# Patient Record
Sex: Female | Born: 1960 | Race: White | Hispanic: No | Marital: Married | Smoking: Never smoker
Health system: Southern US, Community
[De-identification: ages and names within clinical notes are randomized; demographics above are authoritative.]

## PROBLEM LIST (undated history)

## (undated) DIAGNOSIS — H8110 Benign paroxysmal vertigo, unspecified ear: Secondary | ICD-10-CM

## (undated) DIAGNOSIS — G43009 Migraine without aura, not intractable, without status migrainosus: Secondary | ICD-10-CM

## (undated) DIAGNOSIS — F329 Major depressive disorder, single episode, unspecified: Secondary | ICD-10-CM

## (undated) HISTORY — DX: Migraine without aura, not intractable, without status migrainosus: G43.009

## (undated) HISTORY — PX: TUBAL LIGATION: SHX77

## (undated) HISTORY — DX: Major depressive disorder, single episode, unspecified: F32.9

## (undated) HISTORY — DX: Benign paroxysmal vertigo, unspecified ear: H81.10

---

## 2005-02-02 LAB — CBC AND DIFFERENTIAL
HEMOGLOBIN: 14.3 g/dL (ref 12.0–16.0)
Platelets: 236 10*3/uL (ref 150–399)
WBC: 5.6 10*3/mL

## 2005-02-02 LAB — LIPID PANEL
CHOLESTEROL: 178 mg/dL (ref 0–200)
HDL: 77 mg/dL — AB (ref 35–70)
LDL Cholesterol: 90 mg/dL
TRIGLYCERIDES: 52 mg/dL (ref 40–160)

## 2005-02-02 LAB — BASIC METABOLIC PANEL
BUN: 9 mg/dL (ref 4–21)
Creatinine: 0.8 mg/dL (ref 0.5–1.1)
Glucose: 86 mg/dL
POTASSIUM: 4.4 mmol/L (ref 3.4–5.3)
SODIUM: 139 mmol/L (ref 137–147)

## 2005-02-02 LAB — HEPATIC FUNCTION PANEL
ALT: 15 U/L (ref 7–35)
AST: 19 U/L (ref 13–35)

## 2010-09-08 ENCOUNTER — Ambulatory Visit: Payer: Self-pay | Admitting: Family Medicine

## 2010-09-08 DIAGNOSIS — G43909 Migraine, unspecified, not intractable, without status migrainosus: Secondary | ICD-10-CM | POA: Insufficient documentation

## 2010-09-09 ENCOUNTER — Telehealth (INDEPENDENT_AMBULATORY_CARE_PROVIDER_SITE_OTHER): Payer: Self-pay | Admitting: *Deleted

## 2011-01-12 NOTE — Assessment & Plan Note (Signed)
Summary: SEVERE HEADACHE/TJ (5)   Vital Signs:  Patient Profile:   50 Years Old Female CC:      Headache/migraine x yesterday Height:     63 inches Weight:      165 pounds O2 Sat:      98 % O2 treatment:    Room Air Temp:     98.4 degrees F oral Pulse rate:   92 / minute Resp:     16 per minute BP sitting:   122 / 84  (left arm) Cuff size:   regular  Pt. in pain?   yes    Location:   head    Intensity:   8  Vitals Entered By: Lajean Saver RN (September 08, 2010 1:46 PM)                   Updated Prior Medication List: IMITREX 50 MG TABS (SUMATRIPTAN SUCCINATE) 100 mg prn DIAZEPAM 5 MG TABS (DIAZEPAM) prn  Current Allergies: ! HYDROCODONEHistory of Present Illness Chief Complaint: Headache/migraine x yesterday History of Present Illness:  Subjective:  Patient has a long history of migraine headaches that occur several times per month.  Her headaches usually respond to 100mg  Imitrex orally, even after a day or  two.  She developed a typical headache yesterday but did not have any more Imitrex.  She had partial improvement with two Aleve tabs.  No fevers, chills, and sweats.  No nausea/vomiting   REVIEW OF SYSTEMS Constitutional Symptoms      Denies fever, chills, night sweats, weight loss, weight gain, and fatigue.  Eyes       Complains of eye pain.      Denies change in vision, eye discharge, glasses, contact lenses, and eye surgery. Ear/Nose/Throat/Mouth       Complains of sinus problems.      Denies hearing loss/aids, change in hearing, ear pain, ear discharge, dizziness, frequent runny nose, frequent nose bleeds, sore throat, hoarseness, and tooth pain or bleeding.  Respiratory       Denies dry cough, productive cough, wheezing, shortness of breath, asthma, bronchitis, and emphysema/COPD.  Cardiovascular       Denies murmurs, chest pain, and tires easily with exhertion.    Gastrointestinal       Denies stomach pain, nausea/vomiting, diarrhea, constipation,  blood in bowel movements, and indigestion. Genitourniary       Denies painful urination, kidney stones, and loss of urinary control. Neurological       Complains of headaches.      Denies paralysis, seizures, and fainting/blackouts. Musculoskeletal       Denies muscle pain, joint pain, joint stiffness, decreased range of motion, redness, swelling, muscle weakness, and gout.      Comments: neck pain Skin       Denies bruising, unusual mles/lumps or sores, and hair/skin or nail changes.  Psych       Denies mood changes, temper/anger issues, anxiety/stress, speech problems, depression, and sleep problems.  Past History:  Past Medical History: Migraines  Past Surgical History: Tubal ligation boney lesion on forehead  Family History: Family History Diabetes 1st degree relative Family History High cholesterol  Social History: Never Smoked Alcohol use-no Drug use-no Smoking Status:  never Drug Use:  no   Objective:  Appearance:  Patient appears healthy, stated age, and in no acute distress  Eyes:  Pupils are equal, round, and reactive to light and accomdation.  Extraocular movement is intact.  Conjunctivae are not inflamed.  Fundi normal Ears:  Canals normal.  Tympanic membranes normal.   Nose:   No sinus tenderness Pharynx:  Normal  Neck:  Supple.  No adenopathy is present.   Lungs:  Clear to auscultation.  Breath sounds are equal.  Heart:  Regular rate and rhythm without murmurs, rubs, or gallops.  Abdomen:  Nontender without masses or hepatosplenomegaly.  Bowel sounds are present.  No CVA or flank tenderness.  Neurologic:  Cranial nerves 2 through 12 are normal.  Patellar, achilles, and elbow reflexes are normal.  Cerebellar function is intact.  Gait and station are normal.    Assessment New Problems: MIGRAINE HEADACHE (ICD-346.90) FAMILY HISTORY DIABETES 1ST DEGREE RELATIVE (ICD-V18.0)   Plan New Medications/Changes: IMITREX 100 MG TABS (SUMATRIPTAN SUCCINATE) 1 by  mouth at first sign of headache.  May repeat once after 2 hours if headache recurs.  Max of 2 tabs/24hr  #9 x 0, 09/08/2010, Donna Christen MD  New Orders: New Patient Level III [id number] Planning Comments:   Resume Imitrex oral.  Return for worsening headache  Follow-up with PCP   The patient and/or caregiver has been counseled thoroughly with regard to medications prescribed including dosage, schedule, interactions, rationale for use, and possible side effects and they verbalize understanding.  Diagnoses and expected course of recovery discussed and will return if not improved as expected or if the condition worsens. Patient and/or caregiver verbalized understanding.  Prescriptions: IMITREX 100 MG TABS (SUMATRIPTAN SUCCINATE) 1 by mouth at first sign of headache.  May repeat once after 2 hours if headache recurs.  Max of 2 tabs/24hr  #9 x 0   Entered and Authorized by:   Donna Christen MD   Signed by:   Donna Christen MD on 09/08/2010   Method used:   Print then Give to Patient   RxID:   [id number]   Orders Added: 1)  New Patient Level III [72536]

## 2011-01-12 NOTE — Progress Notes (Signed)
  Phone Note Outgoing Call Call back at [facility] Phone [phone]   Call placed by: Lajean Saver RN,  September 09, 2010 11:00 AM Call placed to: Patient Summary of Call: Call back: Patient did not answer. Message left with reason for call and call back with any questions

## 2014-10-25 ENCOUNTER — Encounter: Payer: Self-pay | Admitting: Physician Assistant

## 2014-10-25 ENCOUNTER — Ambulatory Visit (INDEPENDENT_AMBULATORY_CARE_PROVIDER_SITE_OTHER): Payer: BC Managed Care – PPO | Admitting: Physician Assistant

## 2014-10-25 VITALS — BP 121/75 | HR 54 | Ht 62.25 in | Wt 165.0 lb

## 2014-10-25 DIAGNOSIS — G43009 Migraine without aura, not intractable, without status migrainosus: Secondary | ICD-10-CM

## 2014-10-25 DIAGNOSIS — F419 Anxiety disorder, unspecified: Secondary | ICD-10-CM

## 2014-10-25 DIAGNOSIS — E669 Obesity, unspecified: Secondary | ICD-10-CM

## 2014-10-25 DIAGNOSIS — R635 Abnormal weight gain: Secondary | ICD-10-CM

## 2014-10-25 DIAGNOSIS — F329 Major depressive disorder, single episode, unspecified: Secondary | ICD-10-CM

## 2014-10-25 DIAGNOSIS — F32A Depression, unspecified: Secondary | ICD-10-CM

## 2014-10-25 HISTORY — DX: Migraine without aura, not intractable, without status migrainosus: G43.009

## 2014-10-25 LAB — VITAMIN B12: Vitamin B-12: 897 pg/mL (ref 211–911)

## 2014-10-25 LAB — TSH: TSH: 3.459 u[IU]/mL (ref 0.350–4.500)

## 2014-10-25 MED ORDER — AMBULATORY NON FORMULARY MEDICATION: Status: DC

## 2014-10-25 MED ORDER — PHENTERMINE-TOPIRAMATE ER 3.75-23 MG PO CP24: ORAL_CAPSULE | ORAL | Status: DC

## 2014-10-25 MED ORDER — SUMATRIPTAN SUCCINATE 100 MG PO TABS: 100.0000 mg | ORAL_TABLET | ORAL | Status: DC | PRN

## 2014-10-25 MED ORDER — PHENTERMINE-TOPIRAMATE ER 7.5-46 MG PO CP24: 1.0000 | ORAL_CAPSULE | Freq: Every morning | ORAL | Status: DC

## 2014-10-26 LAB — VITAMIN D 25 HYDROXY (VIT D DEFICIENCY, FRACTURES): VIT D 25 HYDROXY: 39 ng/mL (ref 30–89)

## 2014-10-29 DIAGNOSIS — E669 Obesity, unspecified: Secondary | ICD-10-CM | POA: Insufficient documentation

## 2014-10-29 DIAGNOSIS — F32A Depression, unspecified: Secondary | ICD-10-CM | POA: Insufficient documentation

## 2014-10-29 DIAGNOSIS — F419 Anxiety disorder, unspecified: Secondary | ICD-10-CM | POA: Insufficient documentation

## 2014-10-29 DIAGNOSIS — E6609 Other obesity due to excess calories: Secondary | ICD-10-CM | POA: Insufficient documentation

## 2014-10-29 DIAGNOSIS — Z6831 Body mass index (BMI) 31.0-31.9, adult: Secondary | ICD-10-CM | POA: Insufficient documentation

## 2014-10-29 DIAGNOSIS — R635 Abnormal weight gain: Secondary | ICD-10-CM | POA: Insufficient documentation

## 2014-10-29 DIAGNOSIS — F329 Major depressive disorder, single episode, unspecified: Secondary | ICD-10-CM | POA: Insufficient documentation

## 2014-10-29 HISTORY — DX: Depression, unspecified: F32.A

## 2014-10-29 NOTE — Progress Notes (Signed)
   Subjective:    Patient ID: Jodi GallusSheila Perry, female    DOB: 10/14/1961, 53 y.o.   MRN: [id number]  HPI Pt is a 53 yo female who presents to the clinic to establish care.   .. Active Ambulatory Problems    Diagnosis Date Noted  . MIGRAINE HEADACHE 09/08/2010  . Migraine without aura and without status migrainosus, not intractable 10/25/2014   Resolved Ambulatory Problems    Diagnosis Date Noted  . No Resolved Ambulatory Problems   No Additional Past Medical History   .Marland Kitchen. Family History  Problem Relation Age of Onset  . Emphysema Mother   . Heart attack Father   . Diabetes Sister    .Marland Kitchen. History   Social History  . Marital Status: Married    Spouse Name: N/A    Number of Children: N/A  . Years of Education: N/A   Occupational History  . Not on file.   Social History Main Topics  . Smoking status: Never Smoker   . Smokeless tobacco: Not on file  . Alcohol Use: No  . Drug Use: No  . Sexual Activity: No   Other Topics Concern  . Not on file   Social History Narrative  . No narrative on file   Migraines with long hx. Went to headache clinic at one point and had injections. imitrex helps with rescue but has migraines at least 3-4 times a week. She has lots of triggers with menstrual cycle, weather, perfumes. She has tried topamax and propranolol in past without benefit.   Pt is frustrated with weight. She is eating a good diet with lots of salads, decreased breads, no fried foods and lots of organic. She does not exercise. Wants something to help with weight loss.    Review of Systems  All other systems reviewed and are negative.      Objective:   Physical Exam  Constitutional: She is oriented to person, place, and time. She appears well-developed and well-nourished.  HENT:  Head: Normocephalic and atraumatic.  Cardiovascular: Normal rate, regular rhythm and normal heart sounds.   Pulmonary/Chest: Effort normal and breath sounds normal.  Neurological: She  is alert and oriented to person, place, and time.  Psychiatric: She has a normal mood and affect. Her behavior is normal.          Assessment & Plan:  Obesity/abnormal weight gain- labs orderd for energy and thyroid. per pt diet seems healthy and balanced. Will start phentermine. Perhaps topamax will also help with headaches. Side effects discussed. Follow up in 2 months.    Migraines- imitrex given for rescue. Take in combination with NSAIDs. May help more with rescue. Let's see how topamax helps with prevention. Follow up in 2 months.   Depression/anxiety- GAD-7 is 6 and PHQ-9 was 10. Discussed treatment. Pt declined medication. Encouraged regular exercise and to consider counseling. Follow up as needed.

## 2014-12-02 ENCOUNTER — Ambulatory Visit (INDEPENDENT_AMBULATORY_CARE_PROVIDER_SITE_OTHER): Payer: BC Managed Care – PPO | Admitting: Physician Assistant

## 2014-12-02 ENCOUNTER — Encounter: Payer: Self-pay | Admitting: Physician Assistant

## 2014-12-02 VITALS — BP 119/76 | HR 78 | Temp 98.0°F | Ht 62.25 in | Wt 161.0 lb

## 2014-12-02 DIAGNOSIS — R635 Abnormal weight gain: Secondary | ICD-10-CM | POA: Diagnosis not present

## 2014-12-02 MED ORDER — PHENTERMINE-TOPIRAMATE ER 11.25-69 MG PO CP24: 1.0000 | ORAL_CAPSULE | Freq: Every morning | ORAL | Status: DC

## 2014-12-02 NOTE — Progress Notes (Signed)
   Subjective:    Patient ID: Candace GallusSheila Bodkin, female    DOB: Sep 16, 1961, 53 y.o.   MRN: [id number]  HPI Pt presents to the clinic 1 month follow up on qsymia. She is doing well on medication. Lost 4lbs. She feels like she has more energy and decreasing food intake. She also feels like frequency of migraines has increased. She is currently walking for exercise. Denies any insomnia, palpitations, headaches etc.    Review of Systems  All other systems reviewed and are negative.      Objective:   Physical Exam  Constitutional: She is oriented to person, place, and time. She appears well-developed and well-nourished.  HENT:  Head: Normocephalic and atraumatic.  Cardiovascular: Normal rate, regular rhythm and normal heart sounds.   Pulmonary/Chest: Effort normal and breath sounds normal. She has no wheezes.  Neurological: She is alert and oriented to person, place, and time.  Psychiatric: She has a normal mood and affect.          Assessment & Plan:  Abnormal weight gain/overweight- increased qsymia to 11.25/69. Follow up in 3 months. Continue to make diet changes and increase exercise.

## 2014-12-19 ENCOUNTER — Encounter: Payer: Self-pay | Admitting: Physician Assistant

## 2015-02-04 ENCOUNTER — Telehealth: Payer: Self-pay

## 2015-02-04 NOTE — Telephone Encounter (Signed)
Sent PA for Qsymia 11.25mg -69mg  through cover my meds and waiting on auth. - CF

## 2015-02-07 NOTE — Telephone Encounter (Signed)
Received fax to submit PA for qsymia by fax so I filled out form and refaxed 02/07/2015 waiting on auth - CF

## 2015-02-10 NOTE — Telephone Encounter (Signed)
Received fax from [facility] and they denied coverage for Qsymia - CF

## 2015-02-11 ENCOUNTER — Other Ambulatory Visit: Payer: Self-pay | Admitting: *Deleted

## 2015-02-11 ENCOUNTER — Other Ambulatory Visit: Payer: Self-pay | Admitting: Physician Assistant

## 2015-02-11 ENCOUNTER — Telehealth: Payer: Self-pay | Admitting: *Deleted

## 2015-02-11 MED ORDER — NALTREXONE-BUPROPION HCL ER 8-90 MG PO TB12: ORAL_TABLET | ORAL | Status: DC

## 2015-02-11 NOTE — Telephone Encounter (Signed)
Pt called back & left vm stating that she would like to try one of the alternative weight loss meds per her ins.  Choices are belviq, contrave, or saxenda.

## 2015-02-11 NOTE — Telephone Encounter (Signed)
Called patient and left message that insurance will not cover Phentermine but they did give alternatives that could be covered. Asked patient to call back if she would like to try one of the alternatives. - CF

## 2015-02-11 NOTE — Telephone Encounter (Signed)
Start contrave. Make sure pt picks up coupon. Write first month for taper.  1 po am for 7 days,  1 po am and 1 po pm for 7 days,  2 po am and 2 po pm for 7 days,  2 po am and 2 po pm daily.

## 2015-02-12 NOTE — Telephone Encounter (Signed)
Received PA for Contrave sent through cover my meds waiting on auth. - CF

## 2015-02-12 NOTE — Telephone Encounter (Signed)
Contrave taper sent.  LMOM notifying pt.

## 2015-02-18 ENCOUNTER — Telehealth: Payer: Self-pay | Admitting: *Deleted

## 2015-02-18 NOTE — Telephone Encounter (Signed)
PA approved for contrave.  Approval dates 02/18/15-06/20/15. Pharmacy and pt notified.

## 2015-02-24 NOTE — Telephone Encounter (Signed)
Called [facility] and spoke with Malachi BondsGloria she said the Contrave was approved 02/18/15 - 06/20/15

## 2015-03-03 ENCOUNTER — Ambulatory Visit (INDEPENDENT_AMBULATORY_CARE_PROVIDER_SITE_OTHER): Payer: BLUE CROSS/BLUE SHIELD | Admitting: Physician Assistant

## 2015-03-03 ENCOUNTER — Encounter: Payer: Self-pay | Admitting: Physician Assistant

## 2015-03-03 VITALS — BP 126/87 | HR 74 | Wt 162.0 lb

## 2015-03-03 DIAGNOSIS — Z23 Encounter for immunization: Secondary | ICD-10-CM

## 2015-03-03 DIAGNOSIS — E663 Overweight: Secondary | ICD-10-CM | POA: Diagnosis not present

## 2015-03-03 DIAGNOSIS — G43009 Migraine without aura, not intractable, without status migrainosus: Secondary | ICD-10-CM

## 2015-03-03 DIAGNOSIS — Z299 Encounter for prophylactic measures, unspecified: Secondary | ICD-10-CM

## 2015-03-03 DIAGNOSIS — Z6830 Body mass index (BMI) 30.0-30.9, adult: Secondary | ICD-10-CM | POA: Insufficient documentation

## 2015-03-03 MED ORDER — PHENTERMINE HCL 15 MG PO TBDP: ORAL_TABLET | ORAL | Status: DC

## 2015-03-03 MED ORDER — TOPIRAMATE 50 MG PO TABS: ORAL_TABLET | ORAL | Status: DC

## 2015-03-05 DIAGNOSIS — Z789 Other specified health status: Secondary | ICD-10-CM | POA: Insufficient documentation

## 2015-03-05 NOTE — Progress Notes (Signed)
   Subjective:    Patient ID: Jodi Perry, female    DOB: 04-26-1961, 54 y.o.   MRN: [id number]  HPI Pt presents to the clinic for follow up on weight with contrave. She has liked the appetitie suppression and the decreased cravings for sweets;however she has noticed her migraines have been more and more frequent since starting contrave. She has been on topamax in the past to control but before medication had been doing much better. On average she is having a migraine every other day. Insurance would not pay for belviq or qsymia.    Review of Systems  All other systems reviewed and are negative.      Objective:   Physical Exam  Constitutional: She is oriented to person, place, and time. She appears well-developed and well-nourished.  HENT:  Head: Normocephalic and atraumatic.  Cardiovascular: Normal rate, regular rhythm and normal heart sounds.   Pulmonary/Chest: Effort normal and breath sounds normal.  Neurological: She is alert and oriented to person, place, and time.  Skin: Skin is dry.  Psychiatric: She has a normal mood and affect. Her behavior is normal.          Assessment & Plan:  Migraine- increase seems like medication side effect from contrave. Discussed with patient to taper off. Refill imitrex as needed for rescue. Will start back topamax for migraine prevention and perhaps could help with weight loss. Follow up if migraines persist.   Overweight- topamax may help but will also give phentermine lower dose for next month. Follow up in 1 month. Discussed diet and exercise in addition to medication.

## 2015-03-25 LAB — LIPID PANEL
Cholesterol: 154 mg/dL (ref 0–200)
HDL: 59 mg/dL (ref 35–70)
LDL Cholesterol: 73 mg/dL
Triglycerides: 106 mg/dL (ref 40–160)

## 2015-03-25 LAB — BASIC METABOLIC PANEL: Glucose: 88 mg/dL

## 2015-03-26 ENCOUNTER — Telehealth: Payer: Self-pay | Admitting: Physician Assistant

## 2015-03-26 NOTE — Telephone Encounter (Signed)
Call pt: received copy of labs. Except weight everything else looks good.

## 2015-03-27 NOTE — Telephone Encounter (Signed)
LMOM notifying pt of results.  

## 2015-03-28 ENCOUNTER — Encounter: Payer: Self-pay | Admitting: Physician Assistant

## 2015-04-16 ENCOUNTER — Encounter: Payer: Self-pay | Admitting: Family Medicine

## 2015-04-16 ENCOUNTER — Ambulatory Visit (INDEPENDENT_AMBULATORY_CARE_PROVIDER_SITE_OTHER): Payer: BLUE CROSS/BLUE SHIELD | Admitting: Family Medicine

## 2015-04-16 VITALS — BP 127/87 | HR 72 | Ht 62.0 in | Wt 160.0 lb

## 2015-04-16 DIAGNOSIS — M7741 Metatarsalgia, right foot: Secondary | ICD-10-CM | POA: Diagnosis not present

## 2015-04-16 DIAGNOSIS — E669 Obesity, unspecified: Secondary | ICD-10-CM | POA: Diagnosis not present

## 2015-04-16 DIAGNOSIS — M7742 Metatarsalgia, left foot: Secondary | ICD-10-CM | POA: Diagnosis not present

## 2015-04-16 MED ORDER — PHENTERMINE HCL 30 MG PO TBDP: 1.0000 | ORAL_TABLET | ORAL | Status: DC

## 2015-04-16 NOTE — Progress Notes (Signed)
CC: Candace GallusSheila Perry is a 54 y.o. female is here for FOLLOW-UP meds   Subjective: HPI:  Follow-up obesity: Has been taking 50 mg of phentermine every morning for the past 30 days. She believes she's lost about 3 or 4 pounds since she started this medication and has not noticed any side effects. She's been unable to work out after a long day at work due to unbearable pain in her feet. She would like to start an exercise program however pain is interfering with this. She is trying to eat healthy and she believes the medication is helping with portion control. Denies sleep disturbance anxiety nor any mental disturbance.  She localizes the pain in both feet just proximal to the second third and fourth metatarsal heads. It's present only when bearing weight. It's worse the longer she has to work on her feet. No interventions as of yet. It's been present for at least a month on a daily basis. She denies any overlying skin changes or trauma nor any joint pain elsewhere.   Review Of Systems Outlined In HPI  No past medical history on file.  Past Surgical History  Procedure Laterality Date  . Tubal ligation     Family History  Problem Relation Age of Onset  . Emphysema Mother   . Heart attack Father   . Diabetes Sister     History   Social History  . Marital Status: Married    Spouse Name: N/A  . Number of Children: N/A  . Years of Education: N/A   Occupational History  . Not on file.   Social History Main Topics  . Smoking status: Never Smoker   . Smokeless tobacco: Not on file  . Alcohol Use: No  . Drug Use: No  . Sexual Activity: No   Other Topics Concern  . Not on file   Social History Narrative     Objective: BP 127/87 mmHg  Pulse 72  Ht 5\' 2"  (1.575 m)  Wt 160 lb (72.576 kg)  BMI 29.26 kg/m2  Vital signs reviewed. General: Alert and Oriented, No Acute Distress HEENT: Pupils equal, round, reactive to light. Conjunctivae clear.  External ears unremarkable.  Moist  mucous membranes. Lungs: Clear and comfortable work of breathing, speaking in full sentences without accessory muscle use. Cardiac: Regular rate and rhythm.  Neuro: CN II-XII grossly intact, gait normal. Extremities: No peripheral edema.  Strong peripheral pulses. Hallux valgus bilaterally, hammer toes of the second and third toes bilaterally. Pain is reproduced with palpation of the heads of the second third or fourth metatarsal heads bilaterally without any overlying skin changes Mental Status: No depression, anxiety, nor agitation. Logical though process. Skin: Warm and dry.  Assessment & Plan: Velna HatchetSheila was seen today for follow-up meds.  Diagnoses and all orders for this visit:  Metatarsalgia of both feet  Obesity  Other orders -     Phentermine HCl 30 MG TBDP; Take 1 tablet by mouth every morning.   Metatarsalgia: She was given adhesive pads for her shoes to help offload the metatarsal heads in hopes of helping her pain so she can begin exercising regularly. We did not put the pads in her current footwear instead she will use them in her work shoes.  Discussed proper placement and it may take some minor adjustments to find the right sweet spot. Obesity: Improving continue phentermine, she is not satisfied with her minimal weight loss, she's tolerating phentermine so will slightly increase the dose  Return in about 4  weeks (around 05/14/2015) for Weight check blood pressure nurse visit.

## 2015-05-20 ENCOUNTER — Ambulatory Visit (INDEPENDENT_AMBULATORY_CARE_PROVIDER_SITE_OTHER): Payer: BLUE CROSS/BLUE SHIELD | Admitting: Family Medicine

## 2015-05-20 VITALS — BP 118/80 | HR 60 | Wt 155.0 lb

## 2015-05-20 DIAGNOSIS — R635 Abnormal weight gain: Secondary | ICD-10-CM | POA: Diagnosis not present

## 2015-05-20 MED ORDER — PHENTERMINE HCL 30 MG PO TBDP: 1.0000 | ORAL_TABLET | ORAL | Status: DC

## 2015-05-20 NOTE — Progress Notes (Signed)
Weight loss success, refills provided  

## 2015-05-20 NOTE — Progress Notes (Signed)
   Subjective:    Patient ID: Jodi GallusSheila Perry, female    DOB: [birth year], 54 y.o.   MRN: [id number]  HPI Patient is here for blood pressure and weight check. Denies any trouble sleeping, palpitations, or any other medication problems. Patient does report last week she had a migraine that lasted 3 days and she just began her menstrual cycle today which "may explain why she only lost 5 pounds."   Review of Systems     Objective:   Physical Exam        Assessment & Plan:  Patient has lost weight. A refill for Phentermine will be sent to patient preferred pharmacy. Patient advised to schedule a four week nurse visit and keep her upcoming appointment with her PCP. Verbalized understanding, no further questions.

## 2015-05-30 ENCOUNTER — Other Ambulatory Visit: Payer: Self-pay | Admitting: Physician Assistant

## 2015-06-23 ENCOUNTER — Ambulatory Visit (INDEPENDENT_AMBULATORY_CARE_PROVIDER_SITE_OTHER): Payer: BLUE CROSS/BLUE SHIELD | Admitting: Family Medicine

## 2015-06-23 VITALS — BP 119/78 | HR 78 | Wt 154.0 lb

## 2015-06-23 DIAGNOSIS — R635 Abnormal weight gain: Secondary | ICD-10-CM | POA: Diagnosis not present

## 2015-06-23 MED ORDER — PHENTERMINE HCL 30 MG PO TBDP: 1.0000 | ORAL_TABLET | ORAL | Status: DC

## 2015-06-23 NOTE — Progress Notes (Signed)
   Subjective:    Patient ID: Jodi Perry, female    DOB: 08-02-1961, 54 y.o.   MRN: [id number]  HPI  Jodi Perry is here for blood pressure and weight check. Diet and exercise is going well. Denies trouble sleeping or palpitations.    Review of Systems     Objective:   Physical Exam        Assessment & Plan:  Abnormal weight gain - Patient has lost weight. A refill will be sent to [facility]. Only lost one lb. BP at goal.    Nani Gasseratherine Metheney, MD

## 2015-06-24 ENCOUNTER — Other Ambulatory Visit: Payer: Self-pay | Admitting: Physician Assistant

## 2015-06-29 ENCOUNTER — Other Ambulatory Visit: Payer: Self-pay | Admitting: Physician Assistant

## 2015-07-15 ENCOUNTER — Encounter: Payer: Self-pay | Admitting: Family Medicine

## 2015-07-15 ENCOUNTER — Ambulatory Visit (INDEPENDENT_AMBULATORY_CARE_PROVIDER_SITE_OTHER): Payer: BLUE CROSS/BLUE SHIELD | Admitting: Family Medicine

## 2015-07-15 VITALS — BP 130/86 | HR 71 | Ht 62.0 in | Wt 153.0 lb

## 2015-07-15 DIAGNOSIS — H8110 Benign paroxysmal vertigo, unspecified ear: Secondary | ICD-10-CM

## 2015-07-15 DIAGNOSIS — H8111 Benign paroxysmal vertigo, right ear: Secondary | ICD-10-CM

## 2015-07-15 HISTORY — DX: Benign paroxysmal vertigo, unspecified ear: H81.10

## 2015-07-15 MED ORDER — MECLIZINE HCL 25 MG PO TABS: 25.0000 mg | ORAL_TABLET | Freq: Three times a day (TID) | ORAL | Status: DC | PRN

## 2015-07-15 NOTE — Progress Notes (Signed)
Jodi Perry is a 54 y.o. female who presents to [facility]  today for dizziness dizziness. Patient notes a several week history of dizziness. She describes a room spinning sensation it worsens when she turns her head. She denies any weakness or numbness or loss of function. She gets nauseated but has not vomited. She denies any feeling as if she will pass out chest pains or palpitations or shortness of breath. She has not tried any medications yet.   No past medical history on file. Past Surgical History  Procedure Laterality Date  . Tubal ligation     History  Substance Use Topics  . Smoking status: Never Smoker   . Smokeless tobacco: Not on file  . Alcohol Use: No   ROS as above Medications: Current Outpatient Prescriptions  Medication Sig Dispense Refill  . diclofenac (VOLTAREN) 75 MG EC tablet Take 75 mg by mouth. Take one tablet by mouth 2 times daily.    Marland Kitchen loratadine (CLARITIN) 10 MG tablet Take 10 mg by mouth. Take one by mouth daily    . Phentermine HCl 30 MG TBDP Take 1 tablet by mouth every morning. 30 tablet 0  . pseudoephedrine (SUDAFED) 120 MG 12 hr tablet Take 120 mg by mouth. Take one by mouth twice daily as needed.    . SUMAtriptan (IMITREX) 100 MG tablet TAKE 1 TABLET BY MOUTH AS NEEDED FOR MIGRAINE OR HEADACHE. MAY REPEAT IN 2 HOURS IS HEADACHE PERSIST 10 tablet 5  . tiZANidine (ZANAFLEX) 4 MG tablet Take 4 mg by mouth. Take on tablet by mouth 3 times daily.    Marland Kitchen topiramate (TOPAMAX) 50 MG tablet TAKE ONE TABLET TWICE A DAY. 60 tablet 0  . meclizine (ANTIVERT) 25 MG tablet Take 1-2 tablets (25-50 mg total) by mouth 3 (three) times daily as needed. 60 tablet 1   No current facility-administered medications for this visit.   Allergies  Allergen Reactions  . Contrave [Naltrexone-Bupropion Hcl Er]     Trigger migraines.   . Hydrocodone      Exam:  BP 130/86 mmHg  Pulse 71  Ht  (1.575 m)  Wt 153 lb (69.4 kg)  BMI  27.98 kg/m2 Gen: Well NAD HEENT: EOMI,  MMM PERRL Lungs: Normal work of breathing. CTABL Heart: RRR no MRG Abd: NABS, Soft. Nondistended, Nontender Exts: Brisk capillary refill, warm and well perfused.  Neuro: Alert and oriented normal coordination balance gait and reflexes and strength. Positive right-sided Dix-Hallpike test.  No results found for this or any previous visit (from the past 24 hour(s)). No results found.   Please see individual assessment and plan sections.

## 2015-07-15 NOTE — Patient Instructions (Addendum)
Thank you for coming in today. Call or go to the emergency room if you get worse, have trouble breathing, have chest pains, or palpitations.   Attend vestibular physical therapy.   Do the Eplay maneuver. You can look at the handout and at [employer].  Use meclizine as needed  If you never hear from PT please call [facility]) - [phone]  Benign Positional Vertigo Vertigo means you feel like you or your surroundings are moving when they are not. Benign positional vertigo is the most common form of vertigo. Benign means that the cause of your condition is not serious. Benign positional vertigo is more common in older adults. CAUSES  Benign positional vertigo is the result of an upset in the labyrinth system. This is an area in the middle ear that helps control your balance. This may be caused by a viral infection, head injury, or repetitive motion. However, often no specific cause is found. SYMPTOMS  Symptoms of benign positional vertigo occur when you move your head or eyes in different directions. Some of the symptoms may include:  Loss of balance and falls.  Vomiting.  Blurred vision.  Dizziness.  Nausea.  Involuntary eye movements (nystagmus). DIAGNOSIS  Benign positional vertigo is usually diagnosed by physical exam. If the specific cause of your benign positional vertigo is unknown, your caregiver may perform imaging tests, such as magnetic resonance imaging (MRI) or computed tomography (CT). TREATMENT  Your caregiver may recommend movements or procedures to correct the benign positional vertigo. Medicines such as meclizine, benzodiazepines, and medicines for nausea may be used to treat your symptoms. In rare cases, if your symptoms are caused by certain conditions that affect the inner ear, you may need surgery. HOME CARE INSTRUCTIONS   Follow your caregiver's instructions.  Move slowly. Do not make sudden body or head  movements.  Avoid driving.  Avoid operating heavy machinery.  Avoid performing any tasks that would be dangerous to you or others during a vertigo episode.  Drink enough fluids to keep your urine clear or pale yellow. SEEK IMMEDIATE MEDICAL CARE IF:   You develop problems with walking, weakness, numbness, or using your arms, hands, or legs.  You have difficulty speaking.  You develop severe headaches.  Your nausea or vomiting continues or gets worse.  You develop visual changes.  Your family or friends notice any behavioral changes.  Your condition gets worse.  You have a fever.  You develop a stiff neck or sensitivity to light. MAKE SURE YOU:   Understand these instructions.  Will watch your condition.  Will get help right away if you are not doing well or get worse. Document Released: 09/06/2006 Document Revised: 02/21/2012 Document Reviewed: 08/19/2011 [facility] Patient Information 2015 [place], [place]. This information is not intended to replace advice given to you by your health care provider. Make sure you discuss any questions you have with your health care provider.

## 2015-07-15 NOTE — Assessment & Plan Note (Signed)
Symptoms consistent with BPPV. Referred to vestibular physical therapy. Prescribed meclizine and home Epley maneuvers.

## 2015-07-17 ENCOUNTER — Telehealth: Payer: Self-pay | Admitting: *Deleted

## 2015-07-17 NOTE — Telephone Encounter (Signed)
Pt left vm this afternoon stating that her vertigo has not improved at all.  She's taking the med & doing the exercises.  She wants to know if it's normal to see no improvement.  Please advise.

## 2015-07-18 ENCOUNTER — Ambulatory Visit
Payer: BLUE CROSS/BLUE SHIELD | Attending: Family Medicine | Admitting: Rehabilitative and Restorative Service Providers"

## 2015-07-18 DIAGNOSIS — H8111 Benign paroxysmal vertigo, right ear: Secondary | ICD-10-CM

## 2015-07-18 DIAGNOSIS — R42 Dizziness and giddiness: Secondary | ICD-10-CM | POA: Diagnosis present

## 2015-07-18 NOTE — Telephone Encounter (Signed)
LMOM notifying pt of information. 

## 2015-07-18 NOTE — Telephone Encounter (Signed)
She should be going to vestibular rehabilitation. Referral was placed. Asked patient to call [phone]  to schedule an appointment.

## 2015-07-18 NOTE — Patient Instructions (Signed)
Gaze Stabilization: Tip Card 1.Target must remain in focus, not blurry, and appear stationary while head is in motion. 2.Perform exercises with small head movements (45 to either side of midline). 3.Increase speed of head motion so long as target is in focus. 4.If you wear eyeglasses, be sure you can see target through lens (therapist will give specific instructions for bifocal / progressive lenses). 5.These exercises may provoke dizziness or nausea. Work through these symptoms. If too dizzy, slow head movement slightly. Rest between each exercise. 6.Exercises demand concentration; avoid distractions. 7.For safety, perform standing exercises close to a counter, wall, corner, or next to someone.  Copyright  VHI. All rights reserved.  Gaze Stabilization: Standing Feet Apart   Feet shoulder width apart, keeping eyes on target on wall ___3_ feet away, tilt head down slightly and move head side to side for _10 times. Repeat while moving head up and down for 10 times. Do _2___ sessions per day.  Copyright  VHI. All rights reserved.   Feet Apart, Head Motion - Eyes Open   With eyes open, feet apart, move head slowly: side to side *reach across your body to touch the opposite wall so you look further side to side*. Repeat _5-_10__ times per session. Do __2__ sessions per day.  Copyright  VHI. All rights reserved.  Healthy Back - Shoulder Roll   Stand straight with arms relaxed at sides. Roll shoulders backward continuously. Do __10__ times.   Copyright  VHI. All rights reserved.    Special Instructions: Exercises may bring on mild to moderate symptoms of dizziness or headache that resolve within 30 minutes of completing exercises. If symptoms are lasting longer than 30 minutes, modify your exercises by:  >decreasing the # of times you complete each activity >ensuring your symptoms return to baseline before moving onto the next exercise >dividing up exercises so you do not do them  all in one session, but multiple short sessions throughout the day >doing them once a day until symptoms improve  Return to sleeping on each side to avoid further tightness and pain in neck/shoulder. Stop the sitting up to lying back exercise as long as it is not bringing on dizziness.  *If spinning sensation returns, you can return to doing that exercise.

## 2015-07-19 NOTE — Therapy (Signed)
[facility] [address] [place], [place], [address] Phone: [phone]   Fax:  [phone]  Physical Therapy Evaluation  Patient Details  Name: Jodi Perry MRN: [id number] Date of Birth: June 01, 1961 Referring Provider:  Rodolph Bong, MD  Encounter Date: 07/18/2015      PT End of Session - 07/19/15 0735    Visit Number 1   Number of Visits 4   Date for PT Re-Evaluation 08/18/15   Authorization Type private insurance   PT Start Time 1235   PT Stop Time 1320   PT Time Calculation (min) 45 min   Activity Tolerance Patient tolerated treatment well   Behavior During Therapy [facility] for tasks assessed/performed      No past medical history on file.  Past Surgical History  Procedure Laterality Date  . Tubal ligation      There were no vitals filed for this visit.  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  She reports it lasted all day at onset, then improved after sleeping a full night and returned the next day.  She reports she could go a few days without symtpoms and then they would return lasting x hours.  She woke Monday with worse sensation of spinning and feels symtpoms have not calmed down since then.  She saw MD on Tuesday and began doing exercises, however the exercises are  provoking a migraine.  She awoke today with a migraine and took imitrex.  Symptoms are worse with head turns and any movement is hard to tolerate.  Baseline symptoms described as "a fog" when at rest rated 2/10.    Nausea has subsided since onset.   Pertinent History Patient is out of work since Monday due to worsening symptoms.  She tried work on Wednesday and made it 4 hours.   Patient Stated Goals Treat dizziness to allow return to work,  home activities.   Currently in Pain? --  pain in back at night, not significant at this time.            [facility] PT Assessment - 07/18/15 1247    Assessment   Medical Diagnosis vertigo   Onset Date/Surgical Date 07/02/15   Precautions   Precautions Fall   Balance Screen   Has the patient fallen in the past 6 months No   Has the patient had a decrease in activity level because of a fear of falling?  Yes  when symptoms significant   Is the patient reluctant to leave their home because of a fear of falling?  No   Home Environment   Living Environment Private residence   Living Arrangements Spouse/significant other   Type of Home House   Home Access Level entry   Home Layout One level   Prior Function   Vocation Full time employment  [employer]   Observation/Other Assessments   Focus on Therapeutic Outcomes (FOTO)  51%   Other Surveys  --  dizziness handicap index=54%   Sensation   Light Touch --  numbness in R middle finger new onset            Vestibular Assessment - 07/18/15 1251    Vestibular Assessment   General Observation --  patient moves en bloc   Symptom Behavior  Type of Dizziness Spinning  Fog today, typical symptoms is spinning   Frequency of Dizziness --  daily   Duration of Dizziness --  minutes to hours   Aggravating Factors Turning head quickly  worse mid day   Relieving Factors No known relieving factors   Occulomotor Exam   Occulomotor Alignment Normal   Vestibulo-Occular Reflex   VOR 1 Head Only (x 1 viewing) x 10 reps provokes 4/10 symptoms  x 10 vertical provokes 5/10   Positional Testing   Dix-Hallpike Dix-Hallpike Right;Dix-Hallpike Left   Sidelying Test Sidelying Right;Sidelying Left   Horizontal Canal Testing Horizontal Canal Right;Horizontal Canal Left   Dix-Hallpike Right   Dix-Hallpike Right Symptoms No nystagmus   Dix-Hallpike Left   Dix-Hallpike Left Symptoms No nystagmus   Sidelying Right   Sidelying Right Symptoms  No nystagmus   Sidelying Left   Sidelying Left Symptoms No nystagmus   Horizontal Canal Right   Horizontal Canal Right Symptoms Normal   Horizontal Canal Left   Horizontal Canal Left Symptoms Normal          Vestibular Treatment/Exercise - 07/18/15 0001    Vestibular Treatment/Exercise   Vestibular Treatment Provided Gaze   Gaze Exercises X1 Viewing Horizontal;X1 Viewing Vertical     See other HEP in PT education      PT Education - 07/18/15 1318    Education provided Yes   Education Details HEP: gaze x 1 viewing, shoulder rolls for tightness, and standing head turns side to side.   Person(s) Educated Patient   Methods Explanation;Demonstration;Handout   Comprehension Returned demonstration;Verbalized understanding             PT Long Term Goals - 07/19/15 0735    PT LONG TERM GOAL #1   Title The patient will return demo HEP for gaze adaptation, motion tolerance and high level balance independently.   Baseline Target date 08/18/2015   Time 4   Period Weeks   PT LONG TERM GOAL #2   Title The patient will decrease dizziness handicap index from 54% to < or equal to 30% for decreased self perception of dizziness.   Baseline Target date 08/18/2015   Time 4   Period Weeks   PT LONG TERM GOAL #3   Title The patient will tolerate gaze x 1 adaptation x 30 seconds with dizziness < or equal to 2/10 (4/10 horiz x 10 reps and 5/10 vertical x 10 reps at eval).   Baseline Target date 08/18/2015   Time 4   Period Weeks               Plan - 07/19/15 9604    Clinical Impression Statement The patient is a 54 yo female with recent onset of vertigo with h/o migraines.  She reports earlier in the week feeling a sensation of room spinning with movement to the right.  She feels this symptom has improved since beginning exercises provided by MD.  Those exercises are no longer provoking dizziness, however she is getting migraines after performing.  Since positional component of  symptoms appears to have improved, PT recommended she hold prior exercises.  She continues with motion sensitivity noted during gaze exercises, head turns.  PT provided HEP to address remaining deficits and will reassess further if any positional symptoms return.    Pt will benefit from skilled therapeutic intervention in order to improve on the following deficits Abnormal gait;Dizziness;Decreased balance   Rehab Potential Good   PT Frequency 1x / week   PT  Duration 4 weeks   PT Treatment/Interventions Vestibular;Canalith Repostioning;Neuromuscular re-education;Gait training;Balance training;Therapeutic exercise;Therapeutic activities;Patient/family education;Functional mobility training   PT Next Visit Plan Check HEP, reassess positional symptoms as needed and treat as indicated.   Consulted and Agree with Plan of Care Patient         Problem List Patient Active Problem List   Diagnosis Date Noted  . BPPV (benign paroxysmal positional vertigo) 07/15/2015  . Tetanus toxoid vaccination within the past 10 years 03/05/2015  . Overweight (BMI 25.0-29.9) 03/03/2015  . Depression 10/29/2014  . Obesity 10/29/2014  . Abnormal weight gain 10/29/2014  . Anxiety 10/29/2014  . Migraine without aura and without status migrainosus, not intractable 10/25/2014  . MIGRAINE HEADACHE 09/08/2010    Thank you for the referral of this patient. Margretta Ditty, MPT  Stefanie Hodgens, PT 07/19/2015, 7:43 AM  [facility] [address] [place], [place], [address] Phone: [phone]   Fax:  [phone]

## 2015-07-21 ENCOUNTER — Ambulatory Visit: Payer: BLUE CROSS/BLUE SHIELD | Admitting: Family Medicine

## 2015-07-25 ENCOUNTER — Ambulatory Visit: Payer: BLUE CROSS/BLUE SHIELD | Admitting: Physical Therapy

## 2015-07-25 DIAGNOSIS — H8111 Benign paroxysmal vertigo, right ear: Secondary | ICD-10-CM

## 2015-07-25 DIAGNOSIS — R42 Dizziness and giddiness: Secondary | ICD-10-CM

## 2015-07-26 ENCOUNTER — Encounter: Payer: Self-pay | Admitting: Physical Therapy

## 2015-07-26 NOTE — Therapy (Signed)
[facility] [address] [place], [place], [address] Phone: [phone]   Fax:  [phone]  Physical Therapy Treatment  Patient Details  Name: Jodi Perry MRN: [id number] Date of Birth: 11-09-1961 Referring Provider:  Rodolph Bong, MD  Encounter Date: 07/25/2015      PT End of Session - 07/26/15 2004    Visit Number 2   Number of Visits 4   Date for PT Re-Evaluation 08/18/15   Authorization Type private insurance   PT Start Time 1450   PT Stop Time 1533   PT Time Calculation (min) 43 min      History reviewed. No pertinent past medical history.  Past Surgical History  Procedure Laterality Date  . Tubal ligation      There were no vitals filed for this visit.  Visit Diagnosis:  Dizziness and giddiness  BPPV (benign paroxysmal positional vertigo), right      Subjective Assessment - 07/26/15 1957    Subjective Pt. reports significant improvement in vertigo since last treatment session; states she is doing the exercises daily but feels that she is getting back close to normal   Patient Stated Goals Treat dizziness to allow return to work, home activities.   Currently in Pain? No/denies                          Vestibular Treatment/Exercise - 07/26/15 0001    Vestibular Treatment/Exercise   Vestibular Treatment Provided Habituation   Habituation Exercises 180 degree Turns  trunk rotation in corner 10 reps            Balance Exercises - 07/26/15 2000    Balance Exercises: Standing   Standing Eyes Opened Wide (BOA);Narrow base of support (BOS);Foam/compliant surface;Head turns   Standing Eyes Closed Narrow base of support (BOS);Foam/compliant surface;Head turns;Wide (BOA)   Gait with Head Turns Forward;2 reps  85' with horizontal and vertical head turns           PT Education - 07/26/15 2003    Education Details balance on foam - feet together and apart - EO and EC  with head turns   Person(s) Educated Patient   Methods Explanation;Demonstration;Handout   Comprehension Verbalized understanding;Returned demonstration             PT Long Term Goals - 07/19/15 0735    PT LONG TERM GOAL #1   Title The patient will return demo HEP for gaze adaptation, motion tolerance and high level balance independently.   Baseline Target date 08/18/2015   Time 4   Period Weeks   PT LONG TERM GOAL #2   Title The patient will decrease dizziness handicap index from 54% to < or equal to 30% for decreased self perception of dizziness.   Baseline Target date 08/18/2015   Time 4   Period Weeks   PT LONG TERM GOAL #3   Title The patient will tolerate gaze x 1 adaptation x 30 seconds with dizziness < or equal to 2/10 (4/10 horiz x 10 reps and 5/10 vertical x 10 reps at eval).   Baseline Target date 08/18/2015   Time 4   Period Weeks               Plan - 07/26/15 2004    Clinical Impression Statement Pt. has made significant progress - does demonstrate decr. vestibualr input in maintaining balance but demo potential to improve; pt ready for D/C next session   Pt  will benefit from skilled therapeutic intervention in order to improve on the following deficits Abnormal gait;Dizziness;Decreased balance   Rehab Potential Good   PT Frequency 1x / week   PT Duration 4 weeks   PT Treatment/Interventions Vestibular;Canalith Repostioning;Neuromuscular re-education;Gait training;Balance training;Therapeutic exercise;Therapeutic activities;Patient/family education;Functional mobility training   PT Next Visit Plan check LTG's and D/C?   Consulted and Agree with Plan of Care Patient        Problem List Patient Active Problem List   Diagnosis Date Noted  . BPPV (benign paroxysmal positional vertigo) 07/15/2015  . Tetanus toxoid vaccination within the past 10 years 03/05/2015  . Overweight (BMI 25.0-29.9) 03/03/2015  . Depression 10/29/2014  . Obesity 10/29/2014  .  Abnormal weight gain 10/29/2014  . Anxiety 10/29/2014  . Migraine without aura and without status migrainosus, not intractable 10/25/2014  . MIGRAINE HEADACHE 09/08/2010    Kary Kos, PT 07/26/2015, 8:07 PM  [facility] [address] [place], [place], [address] Phone: [phone]   Fax:  [phone]

## 2015-07-26 NOTE — Patient Instructions (Signed)
Balance: Eyes Closed - Bilateral (Varied Surfaces)   Stand, feet shoulder width, close eyes. Maintain balance __30__ seconds. Repeat __1__ times per set. Do __1__ sets per session. Do _5___ sessions per week. Repeat on compliant surface: foam.  Add head turns side to side and up/down.  Copyright  VHI. All rights reserved.  Feet Together (Compliant Surface) Varied Arm Positions - Eyes Open   With eyes open, standing on compliant surface: ____pillow____, feet together and arms out, look at a stationary object. Hold ___30-45_ seconds. Repeat __1__ times per session. Do 1-2____ sessions per day.  Add head turns with use of targets - horizontal and vertical - 10 reps each  Copyright  VHI. All rights reserved.

## 2015-07-31 ENCOUNTER — Ambulatory Visit: Payer: BLUE CROSS/BLUE SHIELD | Admitting: Rehabilitative and Restorative Service Providers"

## 2015-08-01 ENCOUNTER — Ambulatory Visit (INDEPENDENT_AMBULATORY_CARE_PROVIDER_SITE_OTHER): Payer: BLUE CROSS/BLUE SHIELD | Admitting: Family Medicine

## 2015-08-01 ENCOUNTER — Encounter: Payer: Self-pay | Admitting: Family Medicine

## 2015-08-01 VITALS — BP 121/84 | HR 73 | Ht 62.0 in | Wt 154.0 lb

## 2015-08-01 DIAGNOSIS — L821 Other seborrheic keratosis: Secondary | ICD-10-CM | POA: Insufficient documentation

## 2015-08-01 DIAGNOSIS — R635 Abnormal weight gain: Secondary | ICD-10-CM | POA: Diagnosis not present

## 2015-08-01 DIAGNOSIS — H8111 Benign paroxysmal vertigo, right ear: Secondary | ICD-10-CM

## 2015-08-01 MED ORDER — TRIAMCINOLONE ACETONIDE 0.1 % EX CREA: 1.0000 "application " | TOPICAL_CREAM | Freq: Two times a day (BID) | CUTANEOUS | Status: DC

## 2015-08-01 MED ORDER — PHENTERMINE HCL 30 MG PO TBDP: 1.0000 | ORAL_TABLET | ORAL | Status: DC

## 2015-08-01 NOTE — Assessment & Plan Note (Signed)
Continue phentermine. Patient will come back with a food log and we'll try to exercise most days of the week. Recheck weight in one month

## 2015-08-01 NOTE — Progress Notes (Signed)
Jodi Perry is a 54 y.o. female who presents to [facility]  today for follow-up of vertigo, weight loss, and discuss a new lesion on her left face.  1)  follow-up vertigo. Patient was diagnosed with BPPV last visit. She's had 2 sessions with vestibular rehabilitation and done much better. She is currently asymptomatic.  2) weight loss: Patient has not lost any weight in the last 2 months on phentermine. She notes that she no longer is tracking her intake nor is she exercising much. She's been sick recently with the exercise. She would like to retry dedicated weight loss with phentermine.  3) skin lesion: Patient has a flat lesion on the left side of her face. This been present for several months and is mildly itchy. She is worried it may be some sort of skin cancer. No fevers chills nausea vomiting diarrhea or weight loss. No treatment tried yet.   No past medical history on file. Past Surgical History  Procedure Laterality Date  . Tubal ligation     Social History  Substance Use Topics  . Smoking status: Never Smoker   . Smokeless tobacco: Not on file  . Alcohol Use: No   ROS as above Medications: Current Outpatient Prescriptions  Medication Sig Dispense Refill  . diclofenac (VOLTAREN) 75 MG EC tablet Take 75 mg by mouth. Take one tablet by mouth 2 times daily.    Marland Kitchen loratadine (CLARITIN) 10 MG tablet Take 10 mg by mouth. Take one by mouth daily    . meclizine (ANTIVERT) 25 MG tablet Take 1-2 tablets (25-50 mg total) by mouth 3 (three) times daily as needed. 60 tablet 1  . Phentermine HCl 30 MG TBDP Take 1 tablet by mouth every morning. 30 tablet 0  . pseudoephedrine (SUDAFED) 120 MG 12 hr tablet Take 120 mg by mouth. Take one by mouth twice daily as needed.    . SUMAtriptan (IMITREX) 100 MG tablet TAKE 1 TABLET BY MOUTH AS NEEDED FOR MIGRAINE OR HEADACHE. MAY REPEAT IN 2 HOURS IS HEADACHE PERSIST 10 tablet 5  . tiZANidine (ZANAFLEX) 4 MG tablet  Take 4 mg by mouth. Take on tablet by mouth 3 times daily.    Marland Kitchen topiramate (TOPAMAX) 50 MG tablet TAKE ONE TABLET TWICE A DAY. 60 tablet 0  . triamcinolone cream (KENALOG) 0.1 % Apply 1 application topically 2 (two) times daily. 30 g 0   No current facility-administered medications for this visit.   Allergies  Allergen Reactions  . Contrave [Naltrexone-Bupropion Hcl Er]     Trigger migraines.   . Hydrocodone      Exam:  BP 121/84 mmHg  Pulse 73  Ht  (1.575 m)  Wt 154 lb (69.854 kg)  BMI 28.16 kg/m2  Filed Weights   08/01/15 0949  Weight: 154 lb (69.854 kg)     Gen: Well NAD HEENT: EOMI,  MMM Lungs: Normal work of breathing. CTABL Heart: RRR no MRG Abd: NABS, Soft. Nondistended, Nontender Exts: Brisk capillary refill, warm and well perfused.  Skin: Flat mildly hyperpigmented lesion left face with some scale. Consistent with early seborrheic keratosis  No results found for this or any previous visit (from the past 24 hour(s)). No results found.   Please see individual assessment and plan sections.

## 2015-08-01 NOTE — Patient Instructions (Signed)
Thank you for coming in today. Use the cream on the spot on the face.  Return in 1 month.  Continue phentermine. 2 check of calories using a food log. Bring the food log back in one month  Seborrheic Keratosis Seborrheic keratosis is a common, noncancerous (benign) skin growth that can occur anywhere on the skin.It looks like "stuck-on," waxy, rough, tan, brown, or black spots on the skin. These skin growths can be flat or raised.They are often called "barnacles" because of their pasted-on appearance.Usually, these skin growths appear in adulthood, around age 18, and increase in number as you age. They may also develop during pregnancy or following estrogen therapy. Many people may only have one growth appear in their lifetime, while some people may develop many growths. CAUSES It is unknown what causes these skin growths, but they appear to run in families. SYMPTOMS Seborrheic keratosis is often located on the face, chest, shoulders, back, or other areas. These growths are:  Usually painless, but may become irritated and itchy.  Yellow, brown, black, or other colors.  Slightly raised or have a flat surface.  Sometimes rough or wart-like in texture.  Often waxy on the surface.  Round or oval-shaped.  Sometimes "stuck-on" in appearance.  Sometimes single, but there are usually many growths. Any growth that bleeds, itches on a regular basis, becomes inflamed, or becomes irritated needs to be evaluated by a skin specialist (dermatologist). DIAGNOSIS Diagnosis is mainly based on the way the growths appear. In some cases, it can be difficult to tell this type of skin growth from skin cancer. A skin growth tissue sample (biopsy) may be used to confirm the diagnosis. TREATMENT Most often, treatment is not needed because the skin growths are benign.If the skin growth is irritated easily by clothing or jewelry, causing it to scab or bleed, treatment may be recommended. Patients may also  choose to have the growths removed because they do not like their appearance. Most commonly, these growths are treated with cryosurgery. In cryosurgery, liquid nitrogen is applied to "freeze" the growth. The growth usually falls off within a matter of days. A blister may form and dry into a scab that will also fall off. After the growth or scab falls off, it may leave a dark or light spot on the skin. This color may fade over time, or it may remain permanent on the skin. HOME CARE INSTRUCTIONS If the skin growths are treated with cryosurgery, the treated area needs to be kept clean with water and soap. SEEK MEDICAL CARE IF:  You have questions about these growths or other skin problems.  You develop new symptoms, including:  A change in the appearance of the skin growth.  New growths.  Any bleeding, itching, or pain in the growths.  A skin growth that looks similar to seborrheic keratosis. Document Released: 01/01/2011 Document Revised: 02/21/2012 Document Reviewed: 01/01/2011 [facility] Patient Information 2015 [place], [place]. This information is not intended to replace advice given to you by your health care provider. Make sure you discuss any questions you have with your health care provider.

## 2015-08-01 NOTE — Assessment & Plan Note (Signed)
Skin lesion most consistent with seborrheic keratosis. We'll continue to follow. Use triamcinolone cream as needed for itching.

## 2015-08-01 NOTE — Assessment & Plan Note (Signed)
Improved. Continue vestibular physical therapy as needed.

## 2015-08-08 ENCOUNTER — Other Ambulatory Visit: Payer: Self-pay | Admitting: Physician Assistant

## 2015-09-01 ENCOUNTER — Ambulatory Visit (INDEPENDENT_AMBULATORY_CARE_PROVIDER_SITE_OTHER): Payer: BLUE CROSS/BLUE SHIELD | Admitting: Family Medicine

## 2015-09-01 VITALS — BP 135/85 | HR 75 | Wt 153.0 lb

## 2015-09-01 DIAGNOSIS — H8111 Benign paroxysmal vertigo, right ear: Secondary | ICD-10-CM

## 2015-09-01 DIAGNOSIS — R635 Abnormal weight gain: Secondary | ICD-10-CM

## 2015-09-01 DIAGNOSIS — L821 Other seborrheic keratosis: Secondary | ICD-10-CM

## 2015-09-01 MED ORDER — PHENTERMINE HCL 30 MG PO TBDP: 1.0000 | ORAL_TABLET | ORAL | Status: DC

## 2015-09-01 MED ORDER — SUMATRIPTAN SUCCINATE 100 MG PO TABS: ORAL_TABLET | ORAL | Status: DC

## 2015-09-01 MED ORDER — TOPIRAMATE 50 MG PO TABS: ORAL_TABLET | ORAL | Status: DC

## 2015-09-01 NOTE — Patient Instructions (Signed)
Thank you for coming in today. Follow up with the nurse in 1 month.  Call or go to the emergency room if you get worse, have trouble breathing, have chest pains, or palpitations.

## 2015-09-01 NOTE — Progress Notes (Signed)
Jodi Perry is a 54 y.o. female who presents to [facility]: Primary Care  today for follow up weight loss and seborrheic dermitis and vertigo.    1) Weight: Patient has taken phentermine daily for 1 month. She brings a three-day food log with an average calorie intake of about 1100 cal per day. She states that she is not particularly hungry on this diet. She has lost 1 pound. She exercises some. She denies any chest pain or palpitations on phentermine.  2)  seborrheic keratosis: Much improved with triamcinolone cream  3) vertigo: Much improved  Patient also notes she needs a 90 day supply refill of her Topamax and her Imitrex.   No past medical history on file. Past Surgical History  Procedure Laterality Date  . Tubal ligation     Social History  Substance Use Topics  . Smoking status: Never Smoker   . Smokeless tobacco: Not on file  . Alcohol Use: No   family history includes Diabetes in her sister; Emphysema in her mother; Heart attack in her father.  ROS as above Medications: Current Outpatient Prescriptions  Medication Sig Dispense Refill  . meclizine (ANTIVERT) 25 MG tablet Take 1-2 tablets (25-50 mg total) by mouth 3 (three) times daily as needed. 60 tablet 1  . Phentermine HCl 30 MG TBDP Take 1 tablet by mouth every morning. 30 tablet 0  . pseudoephedrine (SUDAFED) 120 MG 12 hr tablet Take 120 mg by mouth. Take one by mouth twice daily as needed.    . SUMAtriptan (IMITREX) 100 MG tablet TAKE 1 TABLET BY MOUTH AS NEEDED FOR MIGRAINE OR HEADACHE. MAY REPEAT IN 2 HOURS IS HEADACHE PERSIST 21 tablet 1  . tiZANidine (ZANAFLEX) 4 MG tablet Take 4 mg by mouth. Take on tablet by mouth 3 times daily.    Marland Kitchen topiramate (TOPAMAX) 50 MG tablet TAKE ONE TABLET TWICE A DAY. 180 tablet 1  . triamcinolone cream (KENALOG) 0.1 % Apply 1 application topically 2 (two) times daily. 30 g 0  . diclofenac (VOLTAREN) 75 MG EC tablet Take 75 mg by mouth. Take one tablet by  mouth 2 times daily.     No current facility-administered medications for this visit.   Allergies  Allergen Reactions  . Contrave [Naltrexone-Bupropion Hcl Er]     Trigger migraines.   . Hydrocodone      Exam:  BP 135/85 mmHg  Pulse 75  Wt 153 lb (69.4 kg)  SpO2 98% Gen: Well NAD HEENT: EOMI,  MMM Lungs: Normal work of breathing. CTABL Heart: RRR no MRG Abd: NABS, Soft. Nondistended, Nontender Exts: Brisk capillary refill, warm and well perfused.  Skin: Well appearing seborrheic keratosis left face Neuro: Normal balance and gait  No results found for this or any previous visit (from the past 24 hour(s)). No results found.   Please see individual assessment and plan sections.

## 2015-09-01 NOTE — Assessment & Plan Note (Signed)
Doing well with vestibular rehabilitation

## 2015-09-01 NOTE — Assessment & Plan Note (Signed)
Patient should continue to lose weight with a 1100-1500 kcal per day diet and phentermine.  Her baseline nutrition requirement is around 2000 cal per day. If she does not lose weight I would suspect that she probably non logging her food correctly. Recheck with the nurse in one month. Refill phentermine.

## 2015-09-01 NOTE — Assessment & Plan Note (Signed)
Doing well  No change

## 2015-09-03 ENCOUNTER — Telehealth: Payer: Self-pay | Admitting: Family Medicine

## 2015-09-03 NOTE — Telephone Encounter (Signed)
I reviewed the food log. This should certainly cause weight loss. Your baseline metabolic rate should require around 2000 calories a day.

## 2015-09-03 NOTE — Telephone Encounter (Signed)
Left detailed VM informing pt.

## 2015-10-01 ENCOUNTER — Ambulatory Visit (INDEPENDENT_AMBULATORY_CARE_PROVIDER_SITE_OTHER): Payer: BLUE CROSS/BLUE SHIELD | Admitting: Physician Assistant

## 2015-10-01 VITALS — BP 124/85 | HR 76 | Wt 150.0 lb

## 2015-10-01 DIAGNOSIS — R635 Abnormal weight gain: Secondary | ICD-10-CM

## 2015-10-01 MED ORDER — PHENTERMINE HCL 30 MG PO TBDP: 1.0000 | ORAL_TABLET | ORAL | Status: DC

## 2015-10-01 NOTE — Progress Notes (Signed)
   Subjective:    Patient ID: Candace GallusSheila Crossley, female    DOB: 18-Jul-1961, 54 y.o.   MRN: [id number]  HPI  Patient is here for blood pressure and weight check. Denies any trouble sleeping, palpitations, or any other medication problems. Patient reports making some exercise changes along with the new Rx for Phentermine.    Review of Systems     Objective:   Physical Exam        Assessment & Plan:  Patient has lost weight. A refill for Phentermine will be sent to patient preferred pharmacy. Patient advised to schedule a four week nurse visit and keep her upcoming appointment with her PCP. Verbalized understanding, no further questions.

## 2015-10-29 ENCOUNTER — Ambulatory Visit (INDEPENDENT_AMBULATORY_CARE_PROVIDER_SITE_OTHER): Payer: BLUE CROSS/BLUE SHIELD | Admitting: Physician Assistant

## 2015-10-29 VITALS — BP 114/86 | HR 85 | Wt 151.0 lb

## 2015-10-29 DIAGNOSIS — R635 Abnormal weight gain: Secondary | ICD-10-CM

## 2015-10-29 MED ORDER — PHENTERMINE HCL 30 MG PO TBDP: 1.0000 | ORAL_TABLET | ORAL | Status: DC

## 2015-10-29 NOTE — Progress Notes (Signed)
Patient is here for blood pressure and weight check. Denies any trouble sleeping, palpitations, or any other medication problems. Patient reports she has not been working out like she had been due to some eye problems she has been getting addressed, Pt has been started on Restasis eye drops. This information will be updated on her medication list. Patient has not lost weight. A refill for Phentermine will be sent to PCP for review. Patient advised if a new Rx is sent to the Pharmacy, she is to schedule a four week nurse visit and keep her upcoming appointment with her PCP. Verbalized understanding, no further questions.  Will refill for one more month. If no weight loss will discontinue. Tandy GawJade Breeback PA-C

## 2015-10-30 NOTE — Progress Notes (Signed)
Pt advised of refill and weight loss requirement.

## 2015-11-12 ENCOUNTER — Encounter: Payer: Self-pay | Admitting: Rehabilitative and Restorative Service Providers"

## 2015-11-12 NOTE — Therapy (Signed)
[facility] [address] [place], [place], [address] Phone: [phone]   Fax:  [phone]  Patient Details  Name: Jodi Perry MRN: [id number] Date of Birth: Aug 23, 1961 Referring Provider:  No ref. provider found  Encounter Date: last encounter 07/24/2016  PHYSICAL THERAPY DISCHARGE SUMMARY  Visits from Start of Care: 2  Current functional level related to goals / functional outcomes:     PT Long Term Goals - 07/19/15 0735    PT LONG TERM GOAL #1   Title The patient will return demo HEP for gaze adaptation, motion tolerance and high level balance independently.   Baseline Target date 08/18/2015   Time 4   Period Weeks   PT LONG TERM GOAL #2   Title The patient will decrease dizziness handicap index from 54% to < or equal to 30% for decreased self perception of dizziness.   Baseline Target date 08/18/2015   Time 4   Period Weeks   PT LONG TERM GOAL #3   Title The patient will tolerate gaze x 1 adaptation x 30 seconds with dizziness < or equal to 2/10 (4/10 horiz x 10 reps and 5/10 vertical x 10 reps at eval).   Baseline Target date 08/18/2015   Time 4   Period Weeks     *patient did not return after 2nd visit, however reported significant progress from 1st eval to 2nd treatment.   Remaining deficits: See evaluation   Education / Equipment: HEP  Plan: Patient agrees to discharge.  Patient goals were not met. Patient is being discharged due to not returning since the last visit.  ?????       Thank you for the referral of this patient. Rudell Cobb, MPT   [facility] 11/12/2015, 12:25 PM  [facility] [address] [place], [place], [address] Phone: [phone]   Fax:  [phone]

## 2015-12-01 ENCOUNTER — Ambulatory Visit: Payer: BLUE CROSS/BLUE SHIELD

## 2015-12-02 ENCOUNTER — Ambulatory Visit (INDEPENDENT_AMBULATORY_CARE_PROVIDER_SITE_OTHER): Payer: BLUE CROSS/BLUE SHIELD | Admitting: Physician Assistant

## 2015-12-02 VITALS — BP 122/83 | HR 80 | Wt 148.0 lb

## 2015-12-02 DIAGNOSIS — R635 Abnormal weight gain: Secondary | ICD-10-CM

## 2015-12-02 MED ORDER — PHENTERMINE HCL 30 MG PO TBDP: 1.0000 | ORAL_TABLET | ORAL | Status: DC

## 2015-12-02 NOTE — Progress Notes (Signed)
   Subjective:    Patient ID: Jodi GallusSheila Perrell, female    DOB: 10/01/1961, 54 y.o.   MRN: [id number]  HPI  Patient here for blood pressure and weight check.   Review of Systems     Objective:   Physical Exam   Patient has a painful area on her right thumb.  Possibly a callous developing.  Told patient to see how it feels in a few weeks and if still painful she can make an appointment to see the physician.     Assessment & Plan:   Patient had a weight loss of 3lbs since her last visit. Her blood pressure was within normal range. A refill for phentermine will be faxed to the pharmacy.  Patient advised to make follow up appointment in 30 days.

## 2015-12-19 ENCOUNTER — Other Ambulatory Visit: Payer: Self-pay | Admitting: Family Medicine

## 2015-12-30 ENCOUNTER — Ambulatory Visit (INDEPENDENT_AMBULATORY_CARE_PROVIDER_SITE_OTHER): Payer: BLUE CROSS/BLUE SHIELD | Admitting: Physician Assistant

## 2015-12-30 ENCOUNTER — Encounter: Payer: Self-pay | Admitting: Physician Assistant

## 2015-12-30 ENCOUNTER — Ambulatory Visit (INDEPENDENT_AMBULATORY_CARE_PROVIDER_SITE_OTHER): Payer: BLUE CROSS/BLUE SHIELD

## 2015-12-30 ENCOUNTER — Ambulatory Visit: Payer: BLUE CROSS/BLUE SHIELD

## 2015-12-30 VITALS — BP 137/89 | HR 72 | Ht 62.0 in | Wt 146.0 lb

## 2015-12-30 DIAGNOSIS — M79646 Pain in unspecified finger(s): Secondary | ICD-10-CM | POA: Insufficient documentation

## 2015-12-30 DIAGNOSIS — M79644 Pain in right finger(s): Secondary | ICD-10-CM

## 2015-12-30 DIAGNOSIS — G43009 Migraine without aura, not intractable, without status migrainosus: Secondary | ICD-10-CM | POA: Diagnosis not present

## 2015-12-30 DIAGNOSIS — E663 Overweight: Secondary | ICD-10-CM | POA: Diagnosis not present

## 2015-12-30 DIAGNOSIS — R635 Abnormal weight gain: Secondary | ICD-10-CM | POA: Diagnosis not present

## 2015-12-30 MED ORDER — PHENTERMINE HCL 37.5 MG PO TABS: 37.5000 mg | ORAL_TABLET | Freq: Every day | ORAL | Status: DC

## 2015-12-30 MED ORDER — AMITRIPTYLINE HCL 25 MG PO TABS: 25.0000 mg | ORAL_TABLET | Freq: Every day | ORAL | Status: DC

## 2015-12-30 MED ORDER — SUMATRIPTAN SUCCINATE 100 MG PO TABS
ORAL_TABLET | ORAL | Status: DC
Start: 2015-12-30 — End: 2017-02-07

## 2015-12-30 NOTE — Progress Notes (Signed)
   Subjective:    Patient ID: Jodi Perry, female    DOB: 06-19-1961, 55 y.o.   MRN: [id number]  HPI  Pt presents to the clinic to follow up on phentermine and weight loss. She has lost another 3lbs. BMI 26. She has been on for little over a month. Denies any side effects. Doing well. No complaints. Exercising once or twice a week. Making most of her changes via diet.   She also has a painful hard lump of her medial right thumb PIP joint. Present and painful for the last 2 months or so. Does not remember any trauma. She has taken ibuprofen with little relief. It does seem to have gotten a little better but still present. Hx of a bony lesion that was painful and had to be removed from her right upper forehead a few years ago.   She does request refill on migraine medication. She is having on average 5 migraines a month. She has tried beta blocker, topamax preventatives. She would like to try something else.     Review of Systems  All other systems reviewed and are negative.      Objective:   Physical Exam  Constitutional: She is oriented to person, place, and time. She appears well-developed and well-nourished.  HENT:  Head: Normocephalic and atraumatic.  Cardiovascular: Normal rate, regular rhythm and normal heart sounds.   Pulmonary/Chest: Effort normal and breath sounds normal.  Musculoskeletal:  Medial right PIP joint of thumb- tender and a small pea sized palpable lump felt. No warmth or over redness. Good ROM of rt thumb and good strength.   Neurological: She is alert and oriented to person, place, and time.  Psychiatric: She has a normal mood and affect. Her behavior is normal.          Assessment & Plan:  Abnormal weight gain/overweight- BMI 26 almost at goal. Gave 2 months of phentermine. Request pt start cutting tabs in half and transitioning off. No more than 1-3lbs of weight loss at most recent visit. Vital stable. Discuss increasing exercise. Continue to watch  calories.   Right thumb pain/nodule- unclear etiology. Has hx of bony lesion. Will get xray. Not warm and good ROM do not think gout. Maybe cystic. After xray follow up with sports med. Continue ibuprofen  as needed for pain.   Migraine without aura- amitriptyline at bedtime. Discussed sedation and side effects. Refilled imitrex for rescue. Follow up in 2 months to see if decreased monthly migraines.

## 2016-01-05 ENCOUNTER — Encounter: Payer: Self-pay | Admitting: Family Medicine

## 2016-01-05 ENCOUNTER — Ambulatory Visit (INDEPENDENT_AMBULATORY_CARE_PROVIDER_SITE_OTHER): Payer: BLUE CROSS/BLUE SHIELD | Admitting: Family Medicine

## 2016-01-05 VITALS — BP 136/88 | HR 74 | Wt 148.0 lb

## 2016-01-05 DIAGNOSIS — M79644 Pain in right finger(s): Secondary | ICD-10-CM | POA: Diagnosis not present

## 2016-01-05 MED ORDER — DICLOFENAC SODIUM 1 % TD GEL: 2.0000 g | Freq: Four times a day (QID) | TRANSDERMAL | Status: DC

## 2016-01-05 NOTE — Assessment & Plan Note (Addendum)
Unclear etiology of nodule. This may be simple callus or skin changes or early wart. Plan for padding watchful waiting and rest. Will treat additionally with diclofenac gel. Recheck in a month or 2.

## 2016-01-05 NOTE — Progress Notes (Signed)
   Subjective:    I'm seeing this patient as a consultation for:  Tandy Gaw PA-C  CC: Right thumb pain and nodule  HPI: Patient is a two-month history of pain at the right thumb. This is associated with a nodule at the interphalangeal joint on her side. Pain in nodule started after patient did a lot of cutting with scissors and hand activity. She used moleskin and other padding which seemed to help quite a bit. The swelling is still present however the pain is improved and is only mild to moderate. She denies any injury radiating pain weakness or numbness fevers or chills. She's had no evaluation with her PCP where x-ray did not show any bony changes within the swelling. She is able to move her thumb well with no triggering or significant pain. Pressure tends to make her pain worse.  Past medical history, Surgical history, Family history not pertinant except as noted below, Social history, Allergies, and medications have been entered into the medical record, reviewed, and no changes needed.   Review of Systems: No headache, visual changes, nausea, vomiting, diarrhea, constipation, dizziness, abdominal pain, skin rash, fevers, chills, night sweats, weight loss, swollen lymph nodes, body aches, joint swelling, muscle aches, chest pain, shortness of breath, mood changes, visual or auditory hallucinations.   Objective:    Filed Vitals:   01/05/16 1505  BP: 136/88  Pulse: 74   General: Well Developed, well nourished, and in no acute distress.  Neuro/Psych: Alert and oriented x3, extra-ocular muscles intact, able to move all 4 extremities, sensation grossly intact. Skin: Warm and dry, no rashes noted.  Respiratory: Not using accessory muscles, speaking in full sentences, trachea midline.  Cardiovascular: Pulses palpable, no extremity edema. Abdomen: Does not appear distended. MSK: Right thumb has mild swelling and nodule overlying the interphalangeal joint at the ulnar side. There is no  erythema or fluctuance. It is only minimally tender. She has normal thumb motion without triggering. Her strength is intact. The thumb is otherwise completely unremarkable with normal capillary refill sensation and pulses into the wrist.  Limited musculoskeletal ultrasound of the right thumb. The ultrasound probe was placed overlying the nodule without any cystic lesions visible. There appears to be no significant joint effusion. Normal motion. No blood flow within the swelling. No significant bony changes.  X-ray thumb dated December 30, 2015 reviewed  No results found for this or any previous visit (from the past 24 hour(s)). No results found.  Impression and Recommendations:   This case required medical decision making of moderate complexity.

## 2016-01-05 NOTE — Patient Instructions (Signed)
Thank you for coming in today. Pad and protect the thumb.  Apply voltaren gel for pain as needed.  Return in 1 month or so.

## 2016-02-02 ENCOUNTER — Encounter: Payer: Self-pay | Admitting: Family Medicine

## 2016-02-02 ENCOUNTER — Ambulatory Visit (INDEPENDENT_AMBULATORY_CARE_PROVIDER_SITE_OTHER): Payer: BLUE CROSS/BLUE SHIELD | Admitting: Family Medicine

## 2016-02-02 VITALS — BP 133/79 | HR 84 | Wt 150.0 lb

## 2016-02-02 DIAGNOSIS — M79644 Pain in right finger(s): Secondary | ICD-10-CM | POA: Diagnosis not present

## 2016-02-02 NOTE — Patient Instructions (Signed)
Thank you for coming in today.   Return as needed.    

## 2016-02-02 NOTE — Assessment & Plan Note (Signed)
Much improved. Continue padding and protection as needed. Return for recheck and reevaluation as needed.

## 2016-02-02 NOTE — Progress Notes (Signed)
       Jodi Perry is a 55 y.o. female who presents to [facility] today follow-up right thumb pain. Patient was seen last month for right thumb pain and tender nodule. This is thought to be an inflamed callus. She has rested her thumb and use diclofenac gel and feels much better. She is essentially pain free.   No past medical history on file. Past Surgical History  Procedure Laterality Date  . Tubal ligation     Social History  Substance Use Topics  . Smoking status: Never Smoker   . Smokeless tobacco: Not on file  . Alcohol Use: No   family history includes Diabetes in her sister; Emphysema in her mother; Heart attack in her father.  ROS as above Medications: Current Outpatient Prescriptions  Medication Sig Dispense Refill  . amitriptyline (ELAVIL) 25 MG tablet Take 1 tablet (25 mg total) by mouth at bedtime. 90 tablet 0  . cycloSPORINE (RESTASIS) 0.05 % ophthalmic emulsion Place 1 drop into both eyes 2 (two) times daily.    . diclofenac sodium (VOLTAREN) 1 % GEL Apply 2 g topically 4 (four) times daily. To affected joint. 100 g 11  . phentermine (ADIPEX-P) 37.5 MG tablet Take 1 tablet (37.5 mg total) by mouth daily before breakfast. 60 tablet 0  . pseudoephedrine (SUDAFED) 120 MG 12 hr tablet Take 120 mg by mouth. Take one by mouth twice daily as needed.    . SUMAtriptan (IMITREX) 100 MG tablet 1 TAB AS NEEDED FOR MIGRAINE HEADACHE. MAY REPEAT IN 2HRS IF HEADACHE PERSISTS. 21 tablet 11   No current facility-administered medications for this visit.   Allergies  Allergen Reactions  . Contrave [Naltrexone-Bupropion Hcl Er]     Trigger migraines.   . Hydrocodone      Exam:  BP 133/79 mmHg  Pulse 84  Wt 150 lb (68.04 kg) Gen: Well NAD Right thumb tiny radial sided callus at the MCP. Nontender. Normal thumb motion.  No results found for this or any previous visit (from  the past 24 hour(s)). No results found.   Please see individual assessment and plan sections.

## 2016-03-15 ENCOUNTER — Encounter: Payer: Self-pay | Admitting: Physician Assistant

## 2016-03-15 ENCOUNTER — Ambulatory Visit (INDEPENDENT_AMBULATORY_CARE_PROVIDER_SITE_OTHER): Payer: BLUE CROSS/BLUE SHIELD | Admitting: Physician Assistant

## 2016-03-15 VITALS — BP 120/82 | HR 82 | Ht 62.0 in | Wt 151.0 lb

## 2016-03-15 DIAGNOSIS — E663 Overweight: Secondary | ICD-10-CM

## 2016-03-15 DIAGNOSIS — R635 Abnormal weight gain: Secondary | ICD-10-CM

## 2016-03-15 DIAGNOSIS — G43009 Migraine without aura, not intractable, without status migrainosus: Secondary | ICD-10-CM | POA: Diagnosis not present

## 2016-03-15 MED ORDER — AMITRIPTYLINE HCL 10 MG PO TABS: 10.0000 mg | ORAL_TABLET | Freq: Every day | ORAL | Status: DC

## 2016-03-15 MED ORDER — PHENTERMINE HCL 37.5 MG PO TABS: 37.5000 mg | ORAL_TABLET | Freq: Every day | ORAL | Status: DC

## 2016-03-17 NOTE — Progress Notes (Signed)
   Subjective:    Patient ID: Jodi Perry, female    DOB: [birth year], 55 y.o.   MRN: [id number]  HPI Pt is a 55 yo female who presents to clinic to follow up on weight. She has been on phentermine since September of last year. Total weight loss on phentermine is only 5lbs but total last year is 11lbs. No side effects of medication. Pt is exercising. migraines are much better. She has not any since a week after starting elavil. She is concerned though it is making weight loss harder.    Review of Systems See HPI.     Objective:   Physical Exam  Constitutional: She is oriented to person, place, and time. She appears well-developed and well-nourished.  HENT:  Head: Normocephalic and atraumatic.  Cardiovascular: Normal rate, regular rhythm and normal heart sounds.   Pulmonary/Chest: Effort normal and breath sounds normal. She has no wheezes.  Neurological: She is alert and oriented to person, place, and time.  Skin: Skin is dry.  Psychiatric: She has a normal mood and affect. Her behavior is normal.          Assessment & Plan:  Abnormal weight gain/overweight- refilled phentermine for 1 month. Discussed 1/2 tablet then stop. Discussed ways to maintain weight and healthy lifestyle.    Migraines- controlled but concerned about weight gain with elavil. Will decrease elavil to 10mg  at bedtime and see if still prevents migraines with least possibility of side effects.

## 2016-09-15 ENCOUNTER — Encounter: Payer: Self-pay | Admitting: Physician Assistant

## 2016-09-15 ENCOUNTER — Ambulatory Visit (INDEPENDENT_AMBULATORY_CARE_PROVIDER_SITE_OTHER): Payer: BLUE CROSS/BLUE SHIELD | Admitting: Physician Assistant

## 2016-09-15 VITALS — BP 124/79 | HR 63 | Ht 62.0 in | Wt 157.0 lb

## 2016-09-15 DIAGNOSIS — G43009 Migraine without aura, not intractable, without status migrainosus: Secondary | ICD-10-CM | POA: Diagnosis not present

## 2016-09-15 DIAGNOSIS — E663 Overweight: Secondary | ICD-10-CM

## 2016-09-15 MED ORDER — ISOMETHEPTENE-DICHLORAL-APAP 65-100-325 MG PO CAPS: 1.0000 | ORAL_CAPSULE | Freq: Four times a day (QID) | ORAL | 0 refills | Status: DC | PRN

## 2016-09-15 MED ORDER — LORCASERIN HCL 10 MG PO TABS: 1.0000 | ORAL_TABLET | Freq: Two times a day (BID) | ORAL | 2 refills | Status: DC

## 2016-09-17 NOTE — Progress Notes (Signed)
   Subjective:    Patient ID: Jodi Perry, female    DOB: October 06, 1961, 55 y.o.   MRN: [id number]  HPI Pt presents to the clinic to follow up on migraines and weight loss.   Migraines are stable. She is having 3-4 bad ones a month but they can last a few days. She doesn't feel like elavil is helping prevent migraines. She doesn't feel like tripans are helping rescue them either. She is having a lot of tension in her neck.   Phentermine has stopped helping with weight loss. She is remaining stable with weight. She admits she is not doing what she is supposed to to lose weight either. She has not been exercising or eating right.    Review of Systems See HPI.     Objective:   Physical Exam  Constitutional: She is oriented to person, place, and time. She appears well-developed and well-nourished.  HENT:  Head: Normocephalic and atraumatic.  Cardiovascular: Normal rate, regular rhythm and normal heart sounds.   Pulmonary/Chest: Effort normal and breath sounds normal.  Neurological: She is alert and oriented to person, place, and time.  Psychiatric: She has a normal mood and affect. Her behavior is normal.          Assessment & Plan:  Migraine without aura- I think tension could be causing some of migraines. Suggested massage. Discussed drinking more water and limiting diet sodas. Try midrin for rescue. Stop elavil since not working and could be working against her with weight loss.   Overweight/abnormal weight gain- discussed other options. Stop phentermine. Start belviq. Discussed 1200 calorie diet and regular exercise. Follow up in 2 months.

## 2016-09-18 ENCOUNTER — Other Ambulatory Visit: Payer: Self-pay | Admitting: Physician Assistant

## 2016-09-20 ENCOUNTER — Telehealth: Payer: Self-pay | Admitting: *Deleted

## 2016-09-20 NOTE — Telephone Encounter (Signed)
PA submitted through covermymeds Key: [id number] - PA Case ID: [id number]  message left on patient's vm and pharm vm

## 2016-11-29 DIAGNOSIS — M62838 Other muscle spasm: Secondary | ICD-10-CM | POA: Diagnosis not present

## 2016-11-29 DIAGNOSIS — S161XXA Strain of muscle, fascia and tendon at neck level, initial encounter: Secondary | ICD-10-CM | POA: Diagnosis not present

## 2017-02-07 ENCOUNTER — Other Ambulatory Visit: Payer: Self-pay | Admitting: Physician Assistant

## 2017-03-16 ENCOUNTER — Ambulatory Visit: Payer: BLUE CROSS/BLUE SHIELD | Admitting: Physician Assistant

## 2017-03-22 ENCOUNTER — Ambulatory Visit (INDEPENDENT_AMBULATORY_CARE_PROVIDER_SITE_OTHER): Payer: BLUE CROSS/BLUE SHIELD | Admitting: Physician Assistant

## 2017-03-22 ENCOUNTER — Encounter: Payer: Self-pay | Admitting: Physician Assistant

## 2017-03-22 VITALS — BP 130/82 | HR 71 | Ht 62.0 in | Wt 160.0 lb

## 2017-03-22 DIAGNOSIS — Z131 Encounter for screening for diabetes mellitus: Secondary | ICD-10-CM

## 2017-03-22 DIAGNOSIS — F5103 Paradoxical insomnia: Secondary | ICD-10-CM

## 2017-03-22 DIAGNOSIS — Z1159 Encounter for screening for other viral diseases: Secondary | ICD-10-CM | POA: Diagnosis not present

## 2017-03-22 DIAGNOSIS — Z13220 Encounter for screening for lipoid disorders: Secondary | ICD-10-CM | POA: Diagnosis not present

## 2017-03-22 DIAGNOSIS — G43009 Migraine without aura, not intractable, without status migrainosus: Secondary | ICD-10-CM

## 2017-03-22 DIAGNOSIS — Z1231 Encounter for screening mammogram for malignant neoplasm of breast: Secondary | ICD-10-CM

## 2017-03-22 DIAGNOSIS — Z1211 Encounter for screening for malignant neoplasm of colon: Secondary | ICD-10-CM | POA: Diagnosis not present

## 2017-03-22 DIAGNOSIS — Z0000 Encounter for general adult medical examination without abnormal findings: Secondary | ICD-10-CM | POA: Diagnosis not present

## 2017-03-22 MED ORDER — TRAZODONE HCL 50 MG PO TABS: 25.0000 mg | ORAL_TABLET | Freq: Every evening | ORAL | 1 refills | Status: DC | PRN

## 2017-03-22 NOTE — Progress Notes (Signed)
Subjective:    Patient ID: Jodi Perry, female    DOB: 1961-09-13, 56 y.o.   MRN: [id number]  HPI    Review of Systems     Objective:   Physical Exam        Assessment & Plan:    Subjective:     Jodi Perry is a 56 y.o. female and is here for a comprehensive physical exam. The patient reports no problems.  Social History   Social History  . Marital status: Married    Spouse name: N/A  . Number of children: N/A  . Years of education: N/A   Occupational History  . Not on file.   Social History Main Topics  . Smoking status: Never Smoker  . Smokeless tobacco: Never Used  . Alcohol use No  . Drug use: No  . Sexual activity: No   Other Topics Concern  . Not on file   Social History Narrative  . No narrative on file   Health Maintenance  Topic Date Due  . Hepatitis C Screening  [birth year]  . PAP SMEAR  08/13/1983  . MAMMOGRAM  08/12/2012  . COLONOSCOPY  08/12/2012  . INFLUENZA VACCINE  09/15/2017 (Originally 07/13/2017)  . HIV Screening  03/23/2027 (Originally 08/12/1977)  . TETANUS/TDAP  03/02/2025    The following portions of the patient's history were reviewed and updated as appropriate: allergies, current medications, past family history, past medical history, past social history, past surgical history and problem list.  Review of Systems Pertinent items noted in HPI and remainder of comprehensive ROS otherwise negative.   Objective:    BP 130/82   Pulse 71   Ht  (1.575 m)   Wt 160 lb (72.6 kg)   BMI 29.26 kg/m  General appearance: alert, cooperative and appears stated age Head: Normocephalic, without obvious abnormality, atraumatic Eyes: conjunctivae/corneas clear. PERRL, EOM's intact. Fundi benign. Ears: normal TM's and external ear canals both ears Nose: Nares normal. Septum midline. Mucosa normal. No drainage or sinus tenderness. Throat: lips, mucosa, and tongue normal; teeth and gums normal Neck: no adenopathy, no carotid  bruit, no JVD, supple, symmetrical, trachea midline and thyroid not enlarged, symmetric, no tenderness/mass/nodules Back: symmetric, no curvature. ROM normal. No CVA tenderness. Lungs: clear to auscultation bilaterally Heart: regular rate and rhythm, S1, S2 normal, no murmur, click, rub or gallop Abdomen: soft, non-tender; bowel sounds normal; no masses,  no organomegaly Extremities: extremities normal, atraumatic, no cyanosis or edema Pulses: 2+ and symmetric Skin: Skin color, texture, turgor normal. No rashes or lesions Lymph nodes: Cervical, supraclavicular, and axillary nodes normal. Neurologic: Alert and oriented X 3, normal strength and tone. Normal symmetric reflexes. Normal coordination and gait    Assessment:    Healthy female exam.      Plan:    Jodi KitchenMarland KitchenTaleeyah was seen today for migraine and annual exam.  Diagnoses and all orders for this visit:  Routine physical examination -     MM DIGITAL SCREENING BILATERAL; Future -     Lipid panel -     COMPLETE METABOLIC PANEL WITH GFR -     Hepatitis C Antibody -     MM DIGITAL SCREENING BILATERAL  Colon cancer screening  Migraine without aura and without status migrainosus, not intractable  Visit for screening mammogram -     MM DIGITAL SCREENING BILATERAL; Future -     MM DIGITAL SCREENING BILATERAL  Screening for lipid disorders -     Lipid panel  Screening for diabetes  mellitus -     COMPLETE METABOLIC PANEL WITH GFR  Need for hepatitis C screening test -     Hepatitis C Antibody  Paradoxical insomnia  Other orders -     traZODone (DESYREL) 50 MG tablet; Take 0.5-1 tablets (25-50 mg total) by mouth at bedtime as needed for sleep.  .. Depression screen [facility] 2/9 03/22/2017  Decreased Interest 0  Down, Depressed, Hopeless 0  PHQ - 2 Score 0     cologuard paperwork was filled out.  Mammogram ordered.  Discussed vitamin D 800 units and calcium .  Trazodone given to try for sleep.   See After Visit Summary  for Counseling Recommendations

## 2017-03-24 ENCOUNTER — Encounter: Payer: Self-pay | Admitting: Physician Assistant

## 2017-03-24 DIAGNOSIS — F5103 Paradoxical insomnia: Secondary | ICD-10-CM | POA: Insufficient documentation

## 2017-05-03 DIAGNOSIS — H1045 Other chronic allergic conjunctivitis: Secondary | ICD-10-CM | POA: Diagnosis not present

## 2017-05-13 DIAGNOSIS — H04123 Dry eye syndrome of bilateral lacrimal glands: Secondary | ICD-10-CM | POA: Diagnosis not present

## 2017-05-18 ENCOUNTER — Other Ambulatory Visit: Payer: Self-pay | Admitting: Physician Assistant

## 2017-11-22 ENCOUNTER — Other Ambulatory Visit: Payer: Self-pay

## 2017-11-22 ENCOUNTER — Encounter: Payer: Self-pay | Admitting: *Deleted

## 2017-11-22 ENCOUNTER — Emergency Department
Admission: EM | Admit: 2017-11-22 | Discharge: 2017-11-22 | Disposition: A | Discharge status: Home / Self Care | Payer: BLUE CROSS/BLUE SHIELD | Source: Home / Self Care | Attending: Family Medicine | Admitting: Family Medicine

## 2017-11-22 DIAGNOSIS — J019 Acute sinusitis, unspecified: Secondary | ICD-10-CM

## 2017-11-22 DIAGNOSIS — H109 Unspecified conjunctivitis: Secondary | ICD-10-CM

## 2017-11-22 DIAGNOSIS — B9689 Other specified bacterial agents as the cause of diseases classified elsewhere: Secondary | ICD-10-CM

## 2017-11-22 MED ORDER — POLYMYXIN B-TRIMETHOPRIM 10000-0.1 UNIT/ML-% OP SOLN: 1.0000 [drp] | Freq: Four times a day (QID) | OPHTHALMIC | 0 refills | Status: DC

## 2017-11-22 MED ORDER — AMOXICILLIN 500 MG PO CAPS: 500.0000 mg | ORAL_CAPSULE | Freq: Three times a day (TID) | ORAL | 0 refills | Status: DC

## 2017-11-22 NOTE — ED Provider Notes (Signed)
[facility]    CSN: [id number] Arrival date & time: 11/22/17  1458     History   Chief Complaint Chief Complaint  Patient presents with  . Eye Drainage    HPI Jodi Perry is a 56 y.o. female.   HPI Jodi Perry is a 56 y.o. female presenting to UC with c/o gradually worsening bilateral eye itching, redness, and drainage for about 2 weeks.  Associated nasal congestion and post-nasal drip.  She is unsure if she has had headaches from the congestion due to hx of migraines, especially when the weather changes like it has recently.  Denies fever, chills, n/v/d. She has been trying Refresh eye drops w/o relief.   History reviewed. No pertinent past medical history.  Patient Active Problem List   Diagnosis Date Noted  . 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 Thumb pain 12/30/2015  . Seborrheic keratoses 08/01/2015  . BPPV (benign paroxysmal positional vertigo) 07/15/2015  . Tetanus toxoid vaccination within the past 10 years 03/05/2015  . Overweight (BMI 25.0-29.9) 03/03/2015  . Depression 10/29/2014  . Abnormal weight gain 10/29/2014  . Anxiety 10/29/2014  . Migraine without aura and without status migrainosus, not intractable 10/25/2014  . MIGRAINE HEADACHE 09/08/2010    Past Surgical History:  Procedure Laterality Date  . TUBAL LIGATION      OB History    No data available       Home Medications    Prior to Admission medications   Medication Sig Start Date End Date Taking? Authorizing Provider  fexofenadine (ALLEGRA) 180 MG tablet Take 180 mg by mouth daily.   Yes [provider]  Polyvinyl Alcohol-Povidone (REFRESH OP) Apply to eye.   Yes [provider]  amoxicillin (AMOXIL) 500 MG capsule Take 1 capsule (500 mg total) by mouth 3 (three) times daily. 11/22/17   Lurene ShadowPhelps, Angla Delahunt O, PA-C  SUMAtriptan (IMITREX) 100 MG tablet 1 TAB AS NEEDED FOR MIGRAINE HEADACHE. MAY REPEAT IN 2HRS IF HEADACHE PERSISTS. 02/09/17   Breeback, Jade L,  PA-C  traZODone (DESYREL) 50 MG tablet TAKE 1/2 TO 1 TABLET AT BEDTIME AS NEEDED SLEEP 05/18/17   Breeback, Jade L, PA-C  trimethoprim-polymyxin b (POLYTRIM) ophthalmic solution Place 1 drop into both eyes every 6 (six) hours. For 7 days 11/22/17   Rolla PlatePhelps, Hussien Greenblatt O, PA-C    Family History Family History  Problem Relation Age of Onset  . Emphysema Mother   . Heart attack Father   . Diabetes Sister     Social History Social History   Tobacco Use  . Smoking status: Never Smoker  . Smokeless tobacco: Never Used  Substance Use Topics  . Alcohol use: No    Alcohol/week: 0.0 oz  . Drug use: No     Allergies   Contrave [naltrexone-bupropion hcl er]; Hydrocodone; and Topamax [topiramate]   Review of Systems Review of Systems  Constitutional: Negative for chills and fever.  HENT: Positive for congestion and sinus pressure. Negative for ear pain, sore throat, trouble swallowing and voice change.   Eyes: Positive for pain, discharge, redness and itching. Negative for photophobia.  Respiratory: Negative for cough and shortness of breath.   Cardiovascular: Negative for chest pain and palpitations.  Gastrointestinal: Negative for abdominal pain, diarrhea, nausea and vomiting.  Musculoskeletal: Negative for arthralgias, back pain and myalgias.  Skin: Negative for rash.  Neurological: Positive for headaches. Negative for dizziness and light-headedness.     Physical Exam Triage Vital Signs ED Triage Vitals  Enc Vitals Group  BP 11/22/17 1538 (!) 143/90     Pulse Rate 11/22/17 1538 63     Resp 11/22/17 1538 16     Temp 11/22/17 1538 97.8 F (36.6 C)     Temp Source 11/22/17 1538 Oral     SpO2 11/22/17 1538 98 %     Weight 11/22/17 1538 168 lb (76.2 kg)     Height 11/22/17 1538 5\' 3"  (1.6 m)     Head Circumference --      Peak Flow --      Pain Score 11/22/17 1539 0     Pain Loc --      Pain Edu? --      Excl. in GC? --    No data found.  Updated Vital Signs BP (!)  143/90 (BP Location: Right Arm)   Pulse 63   Temp 97.8 F (36.6 C) (Oral)   Resp 16   Ht 5\' 3"  (1.6 m)   Wt 168 lb (76.2 kg)   LMP 07/23/2017   SpO2 98%   BMI 29.76 kg/m    Physical Exam  Constitutional: She is oriented to person, place, and time. She appears well-developed and well-nourished. No distress.  HENT:  Head: Normocephalic and atraumatic.  Right Ear: Tympanic membrane normal.  Left Ear: Tympanic membrane normal.  Nose: Mucosal edema present. Right sinus exhibits maxillary sinus tenderness and frontal sinus tenderness. Left sinus exhibits maxillary sinus tenderness and frontal sinus tenderness.  Mouth/Throat: Uvula is midline, oropharynx is clear and moist and mucous membranes are normal.  Eyes: EOM and lids are normal. Pupils are equal, round, and reactive to light. Lids are everted and swept, no foreign bodies found. Right eye exhibits discharge ( scant). Left eye exhibits discharge ( scant). Right conjunctiva is injected. Left conjunctiva is injected.  Neck: Normal range of motion. Neck supple.  Cardiovascular: Normal rate and regular rhythm.  Pulmonary/Chest: Effort normal and breath sounds normal. No stridor. No respiratory distress. She has no wheezes. She has no rales.  Musculoskeletal: Normal range of motion.  Lymphadenopathy:    She has no cervical adenopathy.  Neurological: She is alert and oriented to person, place, and time.  Skin: Skin is warm and dry. She is not diaphoretic.  Psychiatric: She has a normal mood and affect. Her behavior is normal.  Nursing note and vitals reviewed.    UC Treatments / Results  Labs (all labs ordered are listed, but only abnormal results are displayed) Labs Reviewed - No data to display  EKG  EKG Interpretation None       Radiology No results found.  Procedures Procedures (including critical care time)  Medications Ordered in UC Medications - No data to display   Initial Impression / Assessment and Plan / UC  Course  I have reviewed the triage vital signs and the nursing notes.  Pertinent labs & imaging results that were available during my care of the patient were reviewed by me and considered in my medical decision making (see chart for details).     Final Clinical Impressions(s) / UC Diagnoses   Final diagnoses:  Bacterial conjunctivitis of both eyes  Acute rhinosinusitis   Hx and exam c/w sinusitis and likely bacterial conjunctivitis given duration of worsening symptoms.  F/u with PCP or eye specialist in 1 week if not improving.   ED Discharge Orders        Ordered    trimethoprim-polymyxin b (POLYTRIM) ophthalmic solution  Every 6 hours     11/22/17 1543  amoxicillin (AMOXIL) 500 MG capsule  3 times daily     11/22/17 1543       Controlled Substance Prescriptions [place] Controlled Substance Registry consulted? Not Applicable   Rolla Platehelps, Whitney Bingaman O, PA-C 11/22/17 1758

## 2017-11-22 NOTE — ED Triage Notes (Signed)
Pt c/o LT eye stye, bilateral eyes discharge, redness, and itching x 2 wks. Denies changes to her vision. She has instilled Refresh eye gtts and taken Allegra without relief. She also reports some nasal "stuffiness".

## 2017-12-02 ENCOUNTER — Ambulatory Visit: Payer: BLUE CROSS/BLUE SHIELD | Admitting: Physician Assistant

## 2017-12-02 ENCOUNTER — Encounter: Payer: Self-pay | Admitting: Physician Assistant

## 2017-12-02 VITALS — BP 135/95 | HR 63 | Ht 63.0 in | Wt 166.0 lb

## 2017-12-02 DIAGNOSIS — H1013 Acute atopic conjunctivitis, bilateral: Secondary | ICD-10-CM | POA: Diagnosis not present

## 2017-12-02 DIAGNOSIS — H04123 Dry eye syndrome of bilateral lacrimal glands: Secondary | ICD-10-CM | POA: Diagnosis not present

## 2017-12-02 MED ORDER — LEVOCETIRIZINE DIHYDROCHLORIDE 5 MG PO TABS: 5.0000 mg | ORAL_TABLET | Freq: Every evening | ORAL | 5 refills | Status: DC

## 2017-12-02 MED ORDER — AZELASTINE HCL 0.05 % OP SOLN
1.0000 [drp] | Freq: Two times a day (BID) | OPHTHALMIC | 1 refills | Status: DC
Start: 2017-12-02 — End: 2019-03-06

## 2017-12-02 MED ORDER — METHYLPREDNISOLONE SODIUM SUCC 125 MG IJ SOLR
125.0000 mg | Freq: Once | INTRAMUSCULAR | Status: AC
  Administered 2017-12-02: 125 mg via INTRAMUSCULAR

## 2017-12-02 NOTE — Progress Notes (Addendum)
   Subjective:    Patient ID: Jodi Perry, female    DOB: 22-Jun-1961, 56 y.o.   MRN: [id number]  HPI  Patient is a 56 year old female who presents to the clinic to follow-up on bilateral eye redness, itchiness, watering for the last 3 weeks.  She was seen on 11/22/17 in urgent care and treated for sinus infection and bacterial conjunctivitis.  She finished her amoxicillin and has been treating herself with Polytrim eyedrops.  Overall her sinus pressure has improved and she feels much better but her eyes continue to itch, water and are red.  She notes that her eyes burn when she uses the Polytrim eyedrops.  She denies any fever, chills, body aches.  She has recently been to ophthalmology and they did tell her she had dry eyes.  They gave her some eyedrops but they were over $500 a month and she did not get them filled.  .. Active Ambulatory Problems    Diagnosis Date Noted  . MIGRAINE HEADACHE 09/08/2010  . Migraine without aura and without status migrainosus, not intractable 10/25/2014  . Depression 10/29/2014  . Abnormal weight gain 10/29/2014  . Anxiety 10/29/2014  . Overweight (BMI 25.0-29.9) 03/03/2015  . Tetanus toxoid vaccination within the past 10 years 03/05/2015  . BPPV (benign paroxysmal positional vertigo) 07/15/2015  . Seborrheic keratoses 08/01/2015  . Thumb pain 12/30/2015  . 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 Allergic conjunctivitis of both eyes 12/02/2017   Resolved Ambulatory Problems    Diagnosis Date Noted  . Obesity 10/29/2014   No Additional Past Medical History     Review of Systems See HPI.     Objective:   Physical Exam  Constitutional: She is oriented to person, place, and time. She appears well-developed and well-nourished.  HENT:  Head: Normocephalic and atraumatic.  Right Ear: External ear normal.  Left Ear: External ear normal.  Nose: Nose normal.  Mouth/Throat: Oropharynx is clear and moist. No oropharyngeal exudate.  Eyes: Pupils are  equal, round, and reactive to light. Right eye exhibits discharge. Left eye exhibits discharge.  Bilateral injected conjunctiva with watery discharge.   Neck: Normal range of motion. Neck supple.  Cardiovascular: Normal rate, regular rhythm and normal heart sounds.  Pulmonary/Chest: Effort normal and breath sounds normal.  Lymphadenopathy:    She has no cervical adenopathy.  Neurological: She is alert and oriented to person, place, and time.  Psychiatric: She has a normal mood and affect. Her behavior is normal.          Assessment & Plan:  Marland Kitchen.Marland Kitchen.Velna HatchetSheila was seen today for eye irritation.  Diagnoses and all orders for this visit:  Allergic conjunctivitis of both eyes -     azelastine (OPTIVAR) 0.05 % ophthalmic solution; Place 1 drop into both eyes 2 (two) times daily. -     levocetirizine (XYZAL) 5 MG tablet; Take 1 tablet (5 mg total) by mouth every evening. -     methylPREDNISolone sodium succinate (SOLU-MEDROL) 125 mg/2 mL injection 125 mg  Dry eyes  Since the Polytrim and oral antibiotic did not work I suspect her symptoms are due to allergic conjunctivitis.  Handout given.  Discussed Optivar, Xyzal to start today.  She was given a shot of Solu-Medrol in the office.  She will call back next Wednesday and let us know if symptoms are not improving. Encouraged her to look at any new chemicals or irritants that could be causing symptoms to worsen over the past month.

## 2017-12-02 NOTE — Patient Instructions (Signed)
Allergic Conjunctivitis A clear membrane (conjunctiva) covers the white part of your eye and the inner surface of your eyelid. Allergic conjunctivitis happens when this membrane has inflammation. This is caused by allergies. Common causes of allergic reactions (allergens)include:  Outdoor allergens, such as:  Pollen.  Grass and weeds.  Mold spores.  Indoor allergens, such as:  Dust.  Smoke.  Mold.  Pet dander.  Animal hair. This condition can make your eye red or pink. It can also make your eye feel itchy. This condition cannot be spread from one person to another person (is not contagious). Follow these instructions at home:  Try not to be around things that you are allergic to.  Take or apply over-the-counter and prescription medicines only as told by your doctor. These include any eye drops.  Place a cool, clean washcloth on your eye for 10-20 minutes. Do this 3-4 times a day.  Do not touch or rub your eyes.  Do not wear contact lenses until the inflammation is gone. Wear glasses instead.  Do not wear eye makeup until the inflammation is gone.  Keep all follow-up visits as told by your doctor. This is important. Contact a doctor if:  Your symptoms get worse.  Your symptoms do not get better with treatment.  You have mild eye pain.  You are sensitive to light,  You have spots or blisters on your eyes.  You have pus coming from your eye.  You have a fever. Get help right away if:  You have redness, swelling, or other symptoms in only one eye.  Your vision is blurry.  You have vision changes.  You have very bad eye pain. Summary  Allergic conjunctivitis is caused by allergies. It can make your eye red or pink, and it can make your eye feel itchy.  This condition cannot be spread from one person to another person (is not contagious).  Try not to be around things that you are allergic to.  Take or apply over-the-counter and prescription medicines  only as told by your doctor. These include any eye drops.  Contact your doctor if your symptoms get worse or they do not get better with treatment. This information is not intended to replace advice given to you by your health care provider. Make sure you discuss any questions you have with your health care provider. Document Released: 05/19/2010 Document Revised: 07/23/2016 Document Reviewed: 07/23/2016 Elsevier Interactive Patient Education  2017 Elsevier Inc.  

## 2018-02-15 ENCOUNTER — Other Ambulatory Visit: Payer: Self-pay | Admitting: Physician Assistant

## 2018-06-25 DIAGNOSIS — M545 Low back pain: Secondary | ICD-10-CM | POA: Diagnosis not present

## 2018-06-26 ENCOUNTER — Other Ambulatory Visit: Payer: Self-pay | Admitting: Physician Assistant

## 2018-06-26 DIAGNOSIS — H1013 Acute atopic conjunctivitis, bilateral: Secondary | ICD-10-CM

## 2018-07-06 ENCOUNTER — Ambulatory Visit: Payer: BLUE CROSS/BLUE SHIELD | Admitting: Family Medicine

## 2018-07-06 ENCOUNTER — Encounter: Payer: Self-pay | Admitting: Family Medicine

## 2018-07-06 VITALS — BP 149/92 | HR 75 | Ht 63.0 in | Wt 172.0 lb

## 2018-07-06 DIAGNOSIS — Z6830 Body mass index (BMI) 30.0-30.9, adult: Secondary | ICD-10-CM

## 2018-07-06 DIAGNOSIS — S39012A Strain of muscle, fascia and tendon of lower back, initial encounter: Secondary | ICD-10-CM

## 2018-07-06 MED ORDER — CELECOXIB 200 MG PO CAPS: ORAL_CAPSULE | ORAL | 2 refills | Status: DC

## 2018-07-06 NOTE — Patient Instructions (Addendum)
Thank you for coming in today. Back yoga and core stability exercises.  Do the pelvis press core and back activity.  Upward facing down position Standing or seating core tigething.  Attend PT if needed.   Take celebrex as needed twice daily for a limited time.   Recheck with me as needed in 4-6 weeks.   Sooner if needed.

## 2018-07-07 ENCOUNTER — Encounter: Payer: Self-pay | Admitting: Family Medicine

## 2018-07-07 NOTE — Progress Notes (Signed)
Jodi Perry is a 57 y.o. female who presents to [facility] today for back pain.  Jodi Perry developed back pain about 2 to 3 weeks ago.  She was doing her core stabilizing exercises when she felt a pulling sensation in her back.  She notes significant lower back pain without radiation initially.  She notes that the back pain is slowly improving but still quite significant.  She was seen in urgent care at [facility] on July 14.  She was thought to have a lumbosacral strain and was prescribed naproxen and hydrocodone.  She notes she continues to be painful but is improving.  She denies any radiating pain weakness or numbness prior bladder dysfunction.  She denies any injury history.  She notes the naproxen causes a bit of stomach upset.  She is not taking the hydrocodone.    ROS:  As above  Exam:  BP (!) 149/92   Pulse 75   Ht 5\' 3"  (1.6 m)   Wt 172 lb (78 kg)   BMI 30.47 kg/m  General: Well Developed, well nourished, and in no acute distress.  Neuro/Psych: Alert and oriented x3, extra-ocular muscles intact, able to move all 4 extremities, sensation grossly intact. Skin: Warm and dry, no rashes noted.  Respiratory: Not using accessory muscles, speaking in full sentences, trachea midline.  Cardiovascular: Pulses palpable, no extremity edema. Abdomen: Does not appear distended. MSK:  L-spine nontender to midline.  Tender palpation right lumbar paraspinal muscle group. Lumbar motion normal flexion and extension.  Pain with rotation and left lateral flexion to the right. Lower extremity strength reflexes and sensation are equal normal throughout. Normal gait.     Assessment and Plan: 57 y.o. female with lumbosacral strain.  Improving but not fully resolved.  Ideally physical therapy would be very helpful.  Patient notes that for her physical therapy is quite expensive.  I spent 15 minutes discussing back stabilizing exercises and core  strengthening exercises.  These do not involve much motion I think will be less likely to cause injury in the future.  Working on core tensing with prone positioning with pelvic press and with alternating hip extension and with alternating shoulder and arm extension.  Additionally will switch from naproxen to Celebrex.  BMI 30.  Weight loss will also help back pain.  Recheck in a few weeks as needed.  Anticipate improvement.  I spent 25 minutes with this patient, greater than 50% was face-to-face time counseling regarding ddx and plan and PT exercises.    Orders Placed This Encounter  Procedures  . Ambulatory referral to Physical Therapy    Referral Priority:   Routine    Referral Type:   Physical Medicine    Referral Reason:   Specialty Services Required    Requested Specialty:   Physical Therapy   Meds ordered this encounter  Medications  . celecoxib (CELEBREX) 200 MG capsule    Sig: One to 2 tablets by mouth daily as needed for pain.    Dispense:  60 capsule    Refill:  2    Historical information moved to improve visibility of documentation.  Past Medical History:  Diagnosis Date  . BPPV (benign paroxysmal positional vertigo) 07/15/2015  . Depression 10/29/2014  . Migraine without aura and without status migrainosus, not intractable 10/25/2014   Past Surgical History:  Procedure Laterality Date  . TUBAL LIGATION     Social History   Tobacco Use  . Smoking status: Never Smoker  .  Smokeless tobacco: Never Used  Substance Use Topics  . Alcohol use: No    Alcohol/week: 0.0 oz   family history includes Diabetes in her sister; Emphysema in her mother; Heart attack in her father.  Medications: Current Outpatient Medications  Medication Sig Dispense Refill  . azelastine (OPTIVAR) 0.05 % ophthalmic solution Place 1 drop into both eyes 2 (two) times daily. 6 mL 1  . fexofenadine (ALLEGRA) 180 MG tablet Take 180 mg by mouth daily.    Marland Kitchen. levocetirizine (XYZAL) 5 MG tablet  Take 1 tablet (5 mg total) by mouth every evening. Needs an appointment 30 tablet 0  . Polyvinyl Alcohol-Povidone (REFRESH OP) Apply to eye.    . SUMAtriptan (IMITREX) 100 MG tablet 1 TAB AS NEEDED FOR MIGRAINE HEADACHE. MAY REPEAT IN 2HRS IF HEADACHE PERSISTS. 21 tablet 0  . traZODone (DESYREL) 50 MG tablet TAKE 1/2 TO 1 TABLET AT BEDTIME AS NEEDED SLEEP 30 tablet 1  . trimethoprim-polymyxin b (POLYTRIM) ophthalmic solution Place 1 drop into both eyes every 6 (six) hours. For 7 days 10 mL 0  . celecoxib (CELEBREX) 200 MG capsule One to 2 tablets by mouth daily as needed for pain. 60 capsule 2   No current facility-administered medications for this visit.    Allergies  Allergen Reactions  . Contrave [Naltrexone-Bupropion Hcl Er]     Trigger migraines.   . Hydrocodone   . Topamax [Topiramate]     Feel weird      Discussed warning signs or symptoms. Please see discharge instructions. Patient expresses understanding.

## 2018-07-23 ENCOUNTER — Other Ambulatory Visit: Payer: Self-pay | Admitting: Physician Assistant

## 2018-07-27 ENCOUNTER — Other Ambulatory Visit: Payer: Self-pay

## 2018-07-27 MED ORDER — SUMATRIPTAN SUCCINATE 100 MG PO TABS: 100.0000 mg | ORAL_TABLET | ORAL | 1 refills | Status: DC | PRN

## 2018-08-09 ENCOUNTER — Other Ambulatory Visit: Payer: Self-pay | Admitting: Physician Assistant

## 2018-08-09 DIAGNOSIS — H1013 Acute atopic conjunctivitis, bilateral: Secondary | ICD-10-CM

## 2018-08-29 ENCOUNTER — Encounter: Payer: Self-pay | Admitting: Physician Assistant

## 2018-08-29 ENCOUNTER — Ambulatory Visit: Payer: BLUE CROSS/BLUE SHIELD | Admitting: Physician Assistant

## 2018-08-29 VITALS — BP 145/92 | HR 75 | Ht 63.0 in | Wt 174.0 lb

## 2018-08-29 DIAGNOSIS — Z13220 Encounter for screening for lipoid disorders: Secondary | ICD-10-CM | POA: Diagnosis not present

## 2018-08-29 DIAGNOSIS — G43009 Migraine without aura, not intractable, without status migrainosus: Secondary | ICD-10-CM

## 2018-08-29 DIAGNOSIS — J302 Other seasonal allergic rhinitis: Secondary | ICD-10-CM | POA: Diagnosis not present

## 2018-08-29 DIAGNOSIS — Z1159 Encounter for screening for other viral diseases: Secondary | ICD-10-CM | POA: Diagnosis not present

## 2018-08-29 DIAGNOSIS — H1013 Acute atopic conjunctivitis, bilateral: Secondary | ICD-10-CM

## 2018-08-29 DIAGNOSIS — Z131 Encounter for screening for diabetes mellitus: Secondary | ICD-10-CM

## 2018-08-29 DIAGNOSIS — R030 Elevated blood-pressure reading, without diagnosis of hypertension: Secondary | ICD-10-CM | POA: Insufficient documentation

## 2018-08-29 DIAGNOSIS — Z1231 Encounter for screening mammogram for malignant neoplasm of breast: Secondary | ICD-10-CM | POA: Diagnosis not present

## 2018-08-29 MED ORDER — SUMATRIPTAN SUCCINATE 100 MG PO TABS: 100.0000 mg | ORAL_TABLET | ORAL | 5 refills | Status: DC | PRN

## 2018-08-29 MED ORDER — FLUTICASONE PROPIONATE 50 MCG/ACT NA SUSP: 2.0000 | Freq: Every day | NASAL | 5 refills | Status: DC

## 2018-08-29 MED ORDER — FEXOFENADINE HCL 180 MG PO TABS: 180.0000 mg | ORAL_TABLET | Freq: Every day | ORAL | 4 refills | Status: DC

## 2018-08-29 NOTE — Progress Notes (Signed)
Subjective:     Patient ID: Jodi Perry, female   DOB: [birth year], 57 y.o.   MRN: [id number]  HPI Patient is a 57 yo female with pmh migraines and seasonal allergies presenting today for a medication refill. Patient has some complaints about allergies and has questions concerning levocetirizine vs. Allegra. Patient had thought she was taking Allegra but realized she was actually taking levocetirizine and states it is not as effective as Allegra and would like to have a refill for Allegra.    Patient reports chronic itchy eyes and has a history of allergic conjunctivitis and states she uses eye drops every day.   Patient reports no problems with sumatriptan for migraines. She states her migraines have become less frequent.  Patient is not taking trazadone. She is taking celebrex as needed for back pain. Reports doing exercises for back while she is at work.   Patient reports feeling warm. She states this seems to be her normal body temperature and attributes it possibly to menopause. Patient was interested in learning about the possibility of visiting an Endocrinologist or GYN to discuss personalized hormone therapy.  .. Active Ambulatory Problems    Diagnosis Date Noted  . Migraine without aura and without status migrainosus, not intractable 10/25/2014  . Anxiety 10/29/2014  . BMI 30.0-30.9,adult 03/03/2015  . Seborrheic keratoses 08/01/2015  . 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 Allergic conjunctivitis of both eyes 12/02/2017  . Seasonal allergies 08/29/2018   Resolved Ambulatory Problems    Diagnosis Date Noted  . MIGRAINE HEADACHE 09/08/2010  . Depression 10/29/2014  . Obesity 10/29/2014  . Abnormal weight gain 10/29/2014  . Tetanus toxoid vaccination within the past 10 years 03/05/2015  . BPPV (benign paroxysmal positional vertigo) 07/15/2015  . Thumb pain 12/30/2015   No Additional Past Medical History      Review of Systems  HENT: Positive for sneezing.   Eyes:  Positive for itching.       Objective:   Physical Exam  Constitutional: She is oriented to person, place, and time. She appears well-developed and well-nourished.  HENT:  Head: Normocephalic and atraumatic.  Cardiovascular: Normal rate, regular rhythm and normal heart sounds.  Pulmonary/Chest: Effort normal and breath sounds normal.  Neurological: She is alert and oriented to person, place, and time.  Psychiatric: She has a normal mood and affect. Her behavior is normal.       Assessment:     Marland KitchenMarland KitchenDiagnoses and all orders for this visit:  Seasonal allergies -     fexofenadine (ALLEGRA) 180 MG tablet; Take 1 tablet (180 mg total) by mouth daily. -     fluticasone (FLONASE) 50 MCG/ACT nasal spray; Place 2 sprays into both nostrils daily.  Visit for screening mammogram -     MM 3D SCREEN BREAST BILATERAL  Need for hepatitis C screening test -     Hepatitis C Antibody  Screening for lipid disorders -     Lipid Panel w/reflex Direct LDL  Screening for diabetes mellitus -     COMPLETE METABOLIC PANEL WITH GFR  Migraine without aura and without status migrainosus, not intractable -     SUMAtriptan (IMITREX) 100 MG tablet; Take 1 tablet (100 mg total) by mouth every 2 (two) hours as needed for migraine. 1 TAB AS NEEDED FOR MIGRAINE HEADACHE. MAY REPEAT IN 2HRS IF HEADACHE PERSISTS. Patient needs a follow-up appointment for future refills.  Allergic conjunctivitis of both eyes -     fexofenadine (ALLEGRA) 180 MG tablet; Take 1 tablet (180  mg total) by mouth daily. -     fluticasone (FLONASE) 50 MCG/ACT nasal spray; Place 2 sprays into both nostrils daily.  Elevated blood pressure reading       Plan:     Discussed with patient not to take Allegra and Xyzal together.  She agreed to take only Allegra.  This was sent to the pharmacy.  I do [facility] she [facility].  I did send this to the pharmacy.  Imitrex was refilled for patient use as needed for migraine  rescue.  She is not having these very often.  Patient is a need for some health maintenance to be completed.  I did go ahead and order mammogram today.  We will send Cologuard to her home as she declines colonoscopy today.  Discussed importance of colon cancer screenings.  Her blood pressure was elevated today.  Discussed-diet as well as Mediterranean diet to incorporate with some exercise to help lose a few pounds.  We will continue to monitor this.  Encourage patient to make a complete physical.  Patient is questioning about a referral to someone who could work with her hormones.  But when questioned on hot flashes, mood changes she declined any of these.  I discussed I do not necessarily think she needs this referral.  We will follow-up in 6 months and see how symptoms are going.  Marland Kitchen..Spent 30 minutes with patient and greater than 50 percent of visit spent counseling patient regarding treatment plan.  Marland Kitchen.Harlon Flor.I, Jade Breeback PA-C, have reviewed and agree with the above documentation in it's entirety.

## 2018-08-29 NOTE — Patient Instructions (Signed)
DASH Eating Plan DASH stands for "Dietary Approaches to Stop Hypertension." The DASH eating plan is a healthy eating plan that has been shown to reduce high blood pressure (hypertension). It may also reduce your risk for type 2 diabetes, heart disease, and stroke. The DASH eating plan may also help with weight loss. What are tips for following this plan? General guidelines  Avoid eating more than 2,300 mg (milligrams) of salt (sodium) a day. If you have hypertension, you may need to reduce your sodium intake to 1,500 mg a day.  Limit alcohol intake to no more than 1 drink a day for nonpregnant women and 2 drinks a day for men. One drink equals 12 oz of beer, 5 oz of wine, or 1 oz of hard liquor.  Work with your health care provider to maintain a healthy body weight or to lose weight. Ask what an ideal weight is for you.  Get at least 30 minutes of exercise that causes your heart to beat faster (aerobic exercise) most days of the week. Activities may include walking, swimming, or biking.  Work with your health care provider or diet and nutrition specialist (dietitian) to adjust your eating plan to your individual calorie needs. Reading food labels  Check food labels for the amount of sodium per serving. Choose foods with less than 5 percent of the Daily Value of sodium. Generally, foods with less than 300 mg of sodium per serving fit into this eating plan.  To find whole grains, look for the word "whole" as the first word in the ingredient list. Shopping  Buy products labeled as "low-sodium" or "no salt added."  Buy fresh foods. Avoid canned foods and premade or frozen meals. Cooking  Avoid adding salt when cooking. Use salt-free seasonings or herbs instead of table salt or sea salt. Check with your health care provider or pharmacist before using salt substitutes.  Do not fry foods. Cook foods using healthy methods such as baking, boiling, grilling, and broiling instead.  Cook with  heart-healthy oils, such as olive, canola, soybean, or sunflower oil. Meal planning   Eat a balanced diet that includes: ? 5 or more servings of fruits and vegetables each day. At each meal, try to fill half of your plate with fruits and vegetables. ? Up to 6-8 servings of whole grains each day. ? Less than 6 oz of lean meat, poultry, or fish each day. A 3-oz serving of meat is about the same size as a deck of cards. One egg equals 1 oz. ? 2 servings of low-fat dairy each day. ? A serving of nuts, seeds, or beans 5 times each week. ? Heart-healthy fats. Healthy fats called Omega-3 fatty acids are found in foods such as flaxseeds and coldwater fish, like sardines, salmon, and mackerel.  Limit how much you eat of the following: ? Canned or prepackaged foods. ? Food that is high in trans fat, such as fried foods. ? Food that is high in saturated fat, such as fatty meat. ? Sweets, desserts, sugary drinks, and other foods with added sugar. ? Full-fat dairy products.  Do not salt foods before eating.  Try to eat at least 2 vegetarian meals each week.  Eat more home-cooked food and less restaurant, buffet, and fast food.  When eating at a restaurant, ask that your food be prepared with less salt or no salt, if possible. What foods are recommended? The items listed may not be a complete list. Talk with your dietitian about what   dietary choices are best for you. Grains Whole-grain or whole-wheat bread. Whole-grain or whole-wheat pasta. Brown rice. Oatmeal. Quinoa. Bulgur. Whole-grain and low-sodium cereals. Pita bread. Low-fat, low-sodium crackers. Whole-wheat flour tortillas. Vegetables Fresh or frozen vegetables (raw, steamed, roasted, or grilled). Low-sodium or reduced-sodium tomato and vegetable juice. Low-sodium or reduced-sodium tomato sauce and tomato paste. Low-sodium or reduced-sodium canned vegetables. Fruits All fresh, dried, or frozen fruit. Canned fruit in natural juice (without  added sugar). Meat and other protein foods Skinless chicken or turkey. Ground chicken or turkey. Pork with fat trimmed off. Fish and seafood. Egg whites. Dried beans, peas, or lentils. Unsalted nuts, nut butters, and seeds. Unsalted canned beans. Lean cuts of beef with fat trimmed off. Low-sodium, lean deli meat. Dairy Low-fat (1%) or fat-free (skim) milk. Fat-free, low-fat, or reduced-fat cheeses. Nonfat, low-sodium ricotta or cottage cheese. Low-fat or nonfat yogurt. Low-fat, low-sodium cheese. Fats and oils Soft margarine without trans fats. Vegetable oil. Low-fat, reduced-fat, or light mayonnaise and salad dressings (reduced-sodium). Canola, safflower, olive, soybean, and sunflower oils. Avocado. Seasoning and other foods Herbs. Spices. Seasoning mixes without salt. Unsalted popcorn and pretzels. Fat-free sweets. What foods are not recommended? The items listed may not be a complete list. Talk with your dietitian about what dietary choices are best for you. Grains Baked goods made with fat, such as croissants, muffins, or some breads. Dry pasta or rice meal packs. Vegetables Creamed or fried vegetables. Vegetables in a cheese sauce. Regular canned vegetables (not low-sodium or reduced-sodium). Regular canned tomato sauce and paste (not low-sodium or reduced-sodium). Regular tomato and vegetable juice (not low-sodium or reduced-sodium). Pickles. Olives. Fruits Canned fruit in a light or heavy syrup. Fried fruit. Fruit in cream or butter sauce. Meat and other protein foods Fatty cuts of meat. Ribs. Fried meat. Bacon. Sausage. Bologna and other processed lunch meats. Salami. Fatback. Hotdogs. Bratwurst. Salted nuts and seeds. Canned beans with added salt. Canned or smoked fish. Whole eggs or egg yolks. Chicken or turkey with skin. Dairy Whole or 2% milk, cream, and half-and-half. Whole or full-fat cream cheese. Whole-fat or sweetened yogurt. Full-fat cheese. Nondairy creamers. Whipped toppings.  Processed cheese and cheese spreads. Fats and oils Butter. Stick margarine. Lard. Shortening. Ghee. Bacon fat. Tropical oils, such as coconut, palm kernel, or palm oil. Seasoning and other foods Salted popcorn and pretzels. Onion salt, garlic salt, seasoned salt, table salt, and sea salt. Worcestershire sauce. Tartar sauce. Barbecue sauce. Teriyaki sauce. Soy sauce, including reduced-sodium. Steak sauce. Canned and packaged gravies. Fish sauce. Oyster sauce. Cocktail sauce. Horseradish that you find on the shelf. Ketchup. Mustard. Meat flavorings and tenderizers. Bouillon cubes. Hot sauce and Tabasco sauce. Premade or packaged marinades. Premade or packaged taco seasonings. Relishes. Regular salad dressings. Where to find more information:  National Heart, Lung, and Blood Institute: www.nhlbi.nih.gov  American Heart Association: www.heart.org Summary  The DASH eating plan is a healthy eating plan that has been shown to reduce high blood pressure (hypertension). It may also reduce your risk for type 2 diabetes, heart disease, and stroke.  With the DASH eating plan, you should limit salt (sodium) intake to 2,300 mg a day. If you have hypertension, you may need to reduce your sodium intake to 1,500 mg a day.  When on the DASH eating plan, aim to eat more fresh fruits and vegetables, whole grains, lean proteins, low-fat dairy, and heart-healthy fats.  Work with your health care provider or diet and nutrition specialist (dietitian) to adjust your eating plan to your individual   calorie needs. This information is not intended to replace advice given to you by your health care provider. Make sure you discuss any questions you have with your health care provider. Document Released: 11/18/2011 Document Revised: 11/22/2016 Document Reviewed: 11/22/2016 Elsevier Interactive Patient Education  2018 Elsevier Inc.  

## 2018-09-01 ENCOUNTER — Ambulatory Visit (INDEPENDENT_AMBULATORY_CARE_PROVIDER_SITE_OTHER): Payer: BLUE CROSS/BLUE SHIELD

## 2018-09-01 ENCOUNTER — Telehealth: Payer: Self-pay

## 2018-09-01 DIAGNOSIS — Z1231 Encounter for screening mammogram for malignant neoplasm of breast: Secondary | ICD-10-CM | POA: Diagnosis not present

## 2018-09-01 MED ORDER — LEVOCETIRIZINE DIHYDROCHLORIDE 5 MG PO TABS: 5.0000 mg | ORAL_TABLET | Freq: Every evening | ORAL | 1 refills | Status: DC

## 2018-09-01 NOTE — Telephone Encounter (Signed)
Velna HatchetSheila called this morning and wanted to let you know that he Allegra prescription you sent over for her is currently not covered by her insurance and was wanting to see if there is another alternative that she can take that might be more cost effective? Thanks!

## 2018-09-01 NOTE — Telephone Encounter (Signed)
Ok can try xyzal to see if cheaper.

## 2018-09-01 NOTE — Progress Notes (Signed)
Call pt: normal mammogram. Follow up in 1 year.

## 2018-09-27 ENCOUNTER — Other Ambulatory Visit: Payer: Self-pay | Admitting: Family Medicine

## 2018-11-16 DIAGNOSIS — J3089 Other allergic rhinitis: Secondary | ICD-10-CM | POA: Diagnosis not present

## 2018-11-20 DIAGNOSIS — J301 Allergic rhinitis due to pollen: Secondary | ICD-10-CM | POA: Diagnosis not present

## 2018-11-28 DIAGNOSIS — J301 Allergic rhinitis due to pollen: Secondary | ICD-10-CM | POA: Diagnosis not present

## 2018-11-29 DIAGNOSIS — J301 Allergic rhinitis due to pollen: Secondary | ICD-10-CM | POA: Diagnosis not present

## 2018-12-04 DIAGNOSIS — J301 Allergic rhinitis due to pollen: Secondary | ICD-10-CM | POA: Diagnosis not present

## 2018-12-08 ENCOUNTER — Other Ambulatory Visit: Payer: Self-pay | Admitting: Family Medicine

## 2018-12-11 DIAGNOSIS — J301 Allergic rhinitis due to pollen: Secondary | ICD-10-CM | POA: Diagnosis not present

## 2018-12-17 DIAGNOSIS — B9689 Other specified bacterial agents as the cause of diseases classified elsewhere: Secondary | ICD-10-CM | POA: Diagnosis not present

## 2018-12-17 DIAGNOSIS — R0982 Postnasal drip: Secondary | ICD-10-CM | POA: Diagnosis not present

## 2018-12-17 DIAGNOSIS — J329 Chronic sinusitis, unspecified: Secondary | ICD-10-CM | POA: Diagnosis not present

## 2018-12-17 DIAGNOSIS — H6503 Acute serous otitis media, bilateral: Secondary | ICD-10-CM | POA: Diagnosis not present

## 2018-12-20 DIAGNOSIS — J301 Allergic rhinitis due to pollen: Secondary | ICD-10-CM | POA: Diagnosis not present

## 2018-12-28 DIAGNOSIS — J3089 Other allergic rhinitis: Secondary | ICD-10-CM | POA: Diagnosis not present

## 2019-01-02 DIAGNOSIS — J3089 Other allergic rhinitis: Secondary | ICD-10-CM | POA: Diagnosis not present

## 2019-01-09 DIAGNOSIS — J301 Allergic rhinitis due to pollen: Secondary | ICD-10-CM | POA: Diagnosis not present

## 2019-01-18 DIAGNOSIS — J301 Allergic rhinitis due to pollen: Secondary | ICD-10-CM | POA: Diagnosis not present

## 2019-01-24 DIAGNOSIS — J301 Allergic rhinitis due to pollen: Secondary | ICD-10-CM | POA: Diagnosis not present

## 2019-01-30 DIAGNOSIS — J301 Allergic rhinitis due to pollen: Secondary | ICD-10-CM | POA: Diagnosis not present

## 2019-02-06 DIAGNOSIS — J301 Allergic rhinitis due to pollen: Secondary | ICD-10-CM | POA: Diagnosis not present

## 2019-02-14 DIAGNOSIS — J301 Allergic rhinitis due to pollen: Secondary | ICD-10-CM | POA: Diagnosis not present

## 2019-02-21 DIAGNOSIS — J301 Allergic rhinitis due to pollen: Secondary | ICD-10-CM | POA: Diagnosis not present

## 2019-02-27 ENCOUNTER — Ambulatory Visit: Payer: BLUE CROSS/BLUE SHIELD | Admitting: Physician Assistant

## 2019-02-28 DIAGNOSIS — J301 Allergic rhinitis due to pollen: Secondary | ICD-10-CM | POA: Diagnosis not present

## 2019-03-05 ENCOUNTER — Other Ambulatory Visit: Payer: Self-pay | Admitting: Family Medicine

## 2019-03-05 ENCOUNTER — Other Ambulatory Visit: Payer: Self-pay | Admitting: Physician Assistant

## 2019-03-06 ENCOUNTER — Ambulatory Visit (INDEPENDENT_AMBULATORY_CARE_PROVIDER_SITE_OTHER): Payer: BLUE CROSS/BLUE SHIELD | Admitting: Physician Assistant

## 2019-03-06 ENCOUNTER — Other Ambulatory Visit: Payer: Self-pay

## 2019-03-06 ENCOUNTER — Encounter: Payer: Self-pay | Admitting: Physician Assistant

## 2019-03-06 VITALS — Temp 98.0°F

## 2019-03-06 DIAGNOSIS — M5441 Lumbago with sciatica, right side: Secondary | ICD-10-CM | POA: Diagnosis not present

## 2019-03-06 DIAGNOSIS — Z13220 Encounter for screening for lipoid disorders: Secondary | ICD-10-CM

## 2019-03-06 DIAGNOSIS — J302 Other seasonal allergic rhinitis: Secondary | ICD-10-CM

## 2019-03-06 DIAGNOSIS — G43009 Migraine without aura, not intractable, without status migrainosus: Secondary | ICD-10-CM | POA: Diagnosis not present

## 2019-03-06 DIAGNOSIS — G8929 Other chronic pain: Secondary | ICD-10-CM

## 2019-03-06 DIAGNOSIS — H1013 Acute atopic conjunctivitis, bilateral: Secondary | ICD-10-CM

## 2019-03-06 DIAGNOSIS — J301 Allergic rhinitis due to pollen: Secondary | ICD-10-CM | POA: Diagnosis not present

## 2019-03-06 DIAGNOSIS — Z131 Encounter for screening for diabetes mellitus: Secondary | ICD-10-CM

## 2019-03-06 DIAGNOSIS — Z1159 Encounter for screening for other viral diseases: Secondary | ICD-10-CM

## 2019-03-06 MED ORDER — CYCLOBENZAPRINE HCL 10 MG PO TABS: 10.0000 mg | ORAL_TABLET | Freq: Three times a day (TID) | ORAL | 0 refills | Status: DC | PRN

## 2019-03-06 MED ORDER — FLUTICASONE PROPIONATE 50 MCG/ACT NA SUSP: 2.0000 | Freq: Every day | NASAL | 5 refills | Status: DC

## 2019-03-06 MED ORDER — LEVOCETIRIZINE DIHYDROCHLORIDE 5 MG PO TABS: 5.0000 mg | ORAL_TABLET | Freq: Every evening | ORAL | 3 refills | Status: DC

## 2019-03-06 MED ORDER — AZELASTINE HCL 0.05 % OP SOLN: 1.0000 [drp] | Freq: Two times a day (BID) | OPHTHALMIC | 1 refills | Status: DC

## 2019-03-06 MED ORDER — SUMATRIPTAN SUCCINATE 100 MG PO TABS: 100.0000 mg | ORAL_TABLET | ORAL | 5 refills | Status: DC | PRN

## 2019-03-06 NOTE — Progress Notes (Signed)
..Virtual Visit via Telephone Note  I connected with Jodi Perry on 03/12/19 at  3:40 PM EDT by telephone and verified that I am speaking with the correct person using two identifiers.   I discussed the limitations, risks, security and privacy concerns of performing an evaluation and management service by telephone and the availability of in person appointments. I also discussed with the patient that there may be a patient responsible charge related to this service. The patient expressed understanding and agreed to proceed.   History of Present Illness: Pt is a 58 yo female with seasonal allergies, migraines, chronic low back pain who calls into the clinic for follow up and to discuss low back pain.   Pt has a lot of allergies and has been to an allergist. She is getting shots once a week but they told her could take 6 months to notice benefit. She continues on allegra, optivar, flonase as well. She does get bronchitis easy and concerned about the covid-19 pandemic.   She has had some migraines recently about once a week. Usually worse during allergy season. Needs imitrex refill.   She is most concerned about her low back pain. She has had pain since 2007 after she was working in the yard shoveling hard ground. She has continued to have intermittent pain that radiates into her buttocks but not down her leg. Some days are worse than others. No saddle anesthesia. No bowel or bladder dysfunction. No strength changes. Does admit down her right side only. She saw Dr. Denyse Amass on 07/06/18 and thought it was more a strain of low back. She was started on celebrex which does help. She has not tried PT or chiropractic services. She is concerned about cost. The pain makes it hard to walk for long periods of time or stand.   .. Active Ambulatory Problems    Diagnosis Date Noted  . Migraine without aura and without status migrainosus, not intractable 10/25/2014  . Anxiety 10/29/2014  . BMI 30.0-30.9,adult  03/03/2015  . Seborrheic keratoses 08/01/2015  . 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 Allergic conjunctivitis of both eyes 12/02/2017  . Seasonal allergies 08/29/2018  . Elevated blood pressure reading 08/29/2018   Resolved Ambulatory Problems    Diagnosis Date Noted  . MIGRAINE HEADACHE 09/08/2010  . Depression 10/29/2014  . Obesity 10/29/2014  . Abnormal weight gain 10/29/2014  . Tetanus toxoid vaccination within the past 10 years 03/05/2015  . BPPV (benign paroxysmal positional vertigo) 07/15/2015  . Thumb pain 12/30/2015   No Additional Past Medical History   Reviewed med, allergy, problem list.    Observations/Objective: No acute distress  .Marland Kitchen Today's Vitals   03/06/19 1541  Temp: 98 F (36.7 C)  TempSrc: Oral   There is no height or weight on file to calculate BMI.   Assessment and Plan: Marland KitchenMarland KitchenDiagnoses and all orders for this visit:  Chronic right-sided low back pain with right-sided sciatica -     cyclobenzaprine (FLEXERIL) 10 MG tablet; Take 1 tablet (10 mg total) by mouth 3 (three) times daily as needed for muscle spasms.  Migraine without aura and without status migrainosus, not intractable -     SUMAtriptan (IMITREX) 100 MG tablet; Take 1 tablet (100 mg total) by mouth every 2 (two) hours as needed for migraine. 1 TAB AS NEEDED FOR MIGRAINE HEADACHE. MAY REPEAT IN 2HRS IF HEADACHE PERSISTS.  Allergic conjunctivitis of both eyes -     levocetirizine (XYZAL) 5 MG tablet; Take 1 tablet (5 mg total) by  mouth every evening. -     azelastine (OPTIVAR) 0.05 % ophthalmic solution; Place 1 drop into both eyes 2 (two) times daily. -     fluticasone (FLONASE) 50 MCG/ACT nasal spray; Place 2 sprays into both nostrils daily.  Seasonal allergies -     levocetirizine (XYZAL) 5 MG tablet; Take 1 tablet (5 mg total) by mouth every evening. -     fluticasone (FLONASE) 50 MCG/ACT nasal spray; Place 2 sprays into both nostrils daily.  Screening for diabetes mellitus -      COMPLETE METABOLIC PANEL WITH GFR  Screening for lipid disorders -     Lipid Panel w/reflex Direct LDL  Need for hepatitis C screening test -     Hepatitis C Antibody  refilled medications. Labs ordered.   Discussed in detail low back pain. At some point need to do more of a work up with imaging. It sounds like could have some lumbar DDD with SI joint dysfunction.  PT is not an option right now due to COVID-19. Discussed exercises to do at home. Will mail a packet of exercises as well.  Discussed ICE/Heat/tens unit.  Continue celebrex.   Follow up in 4 weeks.   Follow Up Instructions:    I discussed the assessment and treatment plan with the patient. The patient was provided an opportunity to ask questions and all were answered. The patient agreed with the plan and demonstrated an understanding of the instructions.   The patient was advised to call back or seek an in-person evaluation if the symptoms worsen or if the condition fails to improve as anticipated.  I provided 21 minutes of non-face-to-face time during this encounter.   Tandy Gaw, PA-C

## 2019-03-12 ENCOUNTER — Encounter: Payer: Self-pay | Admitting: Physician Assistant

## 2019-03-14 ENCOUNTER — Telehealth: Payer: Self-pay | Admitting: Physician Assistant

## 2019-03-14 MED ORDER — HYDROXYZINE HCL 10 MG PO TABS: 10.0000 mg | ORAL_TABLET | Freq: Three times a day (TID) | ORAL | 0 refills | Status: DC | PRN

## 2019-03-14 NOTE — Telephone Encounter (Signed)
See if patient has ever tried vistaril? It can call you don't but not a controlled substance. Only take as needed.

## 2019-03-14 NOTE — Telephone Encounter (Signed)
Done

## 2019-03-14 NOTE — Telephone Encounter (Signed)
Patient has never had Vistaril and would like it sent in. KG LPN

## 2019-03-14 NOTE — Telephone Encounter (Signed)
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 Only cashier at [employer] this morning all the rest called out. Tearful and crying over the phone. Wants to know if you can give her something just for the next couple of weeks with anxiety.  [facility] [address] in [place]

## 2019-03-15 DIAGNOSIS — J301 Allergic rhinitis due to pollen: Secondary | ICD-10-CM | POA: Diagnosis not present

## 2019-03-20 DIAGNOSIS — J301 Allergic rhinitis due to pollen: Secondary | ICD-10-CM | POA: Diagnosis not present

## 2019-03-28 DIAGNOSIS — J301 Allergic rhinitis due to pollen: Secondary | ICD-10-CM | POA: Diagnosis not present

## 2019-03-29 ENCOUNTER — Other Ambulatory Visit: Payer: Self-pay | Admitting: Physician Assistant

## 2019-03-29 DIAGNOSIS — H1013 Acute atopic conjunctivitis, bilateral: Secondary | ICD-10-CM

## 2019-04-05 ENCOUNTER — Other Ambulatory Visit: Payer: Self-pay | Admitting: Physician Assistant

## 2019-04-05 DIAGNOSIS — J301 Allergic rhinitis due to pollen: Secondary | ICD-10-CM | POA: Diagnosis not present

## 2019-04-11 DIAGNOSIS — J301 Allergic rhinitis due to pollen: Secondary | ICD-10-CM | POA: Diagnosis not present

## 2019-04-17 DIAGNOSIS — J301 Allergic rhinitis due to pollen: Secondary | ICD-10-CM | POA: Diagnosis not present

## 2019-04-19 DIAGNOSIS — J301 Allergic rhinitis due to pollen: Secondary | ICD-10-CM | POA: Diagnosis not present

## 2019-04-25 DIAGNOSIS — J301 Allergic rhinitis due to pollen: Secondary | ICD-10-CM | POA: Diagnosis not present

## 2019-05-03 DIAGNOSIS — J301 Allergic rhinitis due to pollen: Secondary | ICD-10-CM | POA: Diagnosis not present

## 2019-05-10 DIAGNOSIS — J301 Allergic rhinitis due to pollen: Secondary | ICD-10-CM | POA: Diagnosis not present

## 2019-05-17 DIAGNOSIS — J301 Allergic rhinitis due to pollen: Secondary | ICD-10-CM | POA: Diagnosis not present

## 2019-05-23 DIAGNOSIS — J301 Allergic rhinitis due to pollen: Secondary | ICD-10-CM | POA: Diagnosis not present

## 2019-05-28 DIAGNOSIS — J301 Allergic rhinitis due to pollen: Secondary | ICD-10-CM | POA: Diagnosis not present

## 2019-06-13 DIAGNOSIS — J301 Allergic rhinitis due to pollen: Secondary | ICD-10-CM | POA: Diagnosis not present

## 2019-06-20 DIAGNOSIS — J301 Allergic rhinitis due to pollen: Secondary | ICD-10-CM | POA: Diagnosis not present

## 2019-06-26 DIAGNOSIS — J301 Allergic rhinitis due to pollen: Secondary | ICD-10-CM | POA: Diagnosis not present

## 2019-07-04 DIAGNOSIS — J301 Allergic rhinitis due to pollen: Secondary | ICD-10-CM | POA: Diagnosis not present

## 2019-07-11 DIAGNOSIS — J301 Allergic rhinitis due to pollen: Secondary | ICD-10-CM | POA: Diagnosis not present

## 2019-07-18 DIAGNOSIS — J301 Allergic rhinitis due to pollen: Secondary | ICD-10-CM | POA: Diagnosis not present

## 2019-07-26 DIAGNOSIS — J301 Allergic rhinitis due to pollen: Secondary | ICD-10-CM | POA: Diagnosis not present

## 2019-08-01 DIAGNOSIS — J301 Allergic rhinitis due to pollen: Secondary | ICD-10-CM | POA: Diagnosis not present

## 2019-08-13 ENCOUNTER — Other Ambulatory Visit: Payer: Self-pay | Admitting: Family Medicine

## 2019-08-14 ENCOUNTER — Other Ambulatory Visit: Payer: Self-pay | Admitting: Physician Assistant

## 2019-08-14 DIAGNOSIS — H1013 Acute atopic conjunctivitis, bilateral: Secondary | ICD-10-CM

## 2019-08-16 DIAGNOSIS — J301 Allergic rhinitis due to pollen: Secondary | ICD-10-CM | POA: Diagnosis not present

## 2019-08-23 DIAGNOSIS — J301 Allergic rhinitis due to pollen: Secondary | ICD-10-CM | POA: Diagnosis not present

## 2019-08-30 DIAGNOSIS — J301 Allergic rhinitis due to pollen: Secondary | ICD-10-CM | POA: Diagnosis not present

## 2019-09-02 ENCOUNTER — Other Ambulatory Visit: Payer: Self-pay | Admitting: Family Medicine

## 2019-09-02 DIAGNOSIS — G43009 Migraine without aura, not intractable, without status migrainosus: Secondary | ICD-10-CM

## 2019-09-05 ENCOUNTER — Other Ambulatory Visit: Payer: Self-pay | Admitting: Physician Assistant

## 2019-09-05 ENCOUNTER — Telehealth: Payer: Self-pay | Admitting: Neurology

## 2019-09-05 DIAGNOSIS — H1013 Acute atopic conjunctivitis, bilateral: Secondary | ICD-10-CM

## 2019-09-05 NOTE — Telephone Encounter (Signed)
Received note from [facility] stating order cancelled due to exceeding 365 days since original order.

## 2019-09-05 NOTE — Telephone Encounter (Signed)
Left message on machine for patient to call back to let us know if she would like [place] to reorder [facility]olog[facility].

## 2019-09-06 DIAGNOSIS — J301 Allergic rhinitis due to pollen: Secondary | ICD-10-CM | POA: Diagnosis not present

## 2019-09-13 DIAGNOSIS — J301 Allergic rhinitis due to pollen: Secondary | ICD-10-CM | POA: Diagnosis not present

## 2019-09-20 ENCOUNTER — Other Ambulatory Visit: Payer: Self-pay | Admitting: Physician Assistant

## 2019-09-20 DIAGNOSIS — J301 Allergic rhinitis due to pollen: Secondary | ICD-10-CM | POA: Diagnosis not present

## 2019-09-20 DIAGNOSIS — H1013 Acute atopic conjunctivitis, bilateral: Secondary | ICD-10-CM

## 2019-09-26 DIAGNOSIS — J301 Allergic rhinitis due to pollen: Secondary | ICD-10-CM | POA: Diagnosis not present

## 2019-10-04 DIAGNOSIS — J301 Allergic rhinitis due to pollen: Secondary | ICD-10-CM | POA: Diagnosis not present

## 2019-10-09 DIAGNOSIS — J301 Allergic rhinitis due to pollen: Secondary | ICD-10-CM | POA: Diagnosis not present

## 2019-10-10 DIAGNOSIS — J301 Allergic rhinitis due to pollen: Secondary | ICD-10-CM | POA: Diagnosis not present

## 2019-10-17 DIAGNOSIS — J301 Allergic rhinitis due to pollen: Secondary | ICD-10-CM | POA: Diagnosis not present

## 2019-11-01 DIAGNOSIS — J301 Allergic rhinitis due to pollen: Secondary | ICD-10-CM | POA: Diagnosis not present

## 2019-11-07 DIAGNOSIS — J301 Allergic rhinitis due to pollen: Secondary | ICD-10-CM | POA: Diagnosis not present

## 2019-11-15 DIAGNOSIS — J301 Allergic rhinitis due to pollen: Secondary | ICD-10-CM | POA: Diagnosis not present

## 2019-11-18 ENCOUNTER — Other Ambulatory Visit: Payer: Self-pay | Admitting: Physician Assistant

## 2019-11-18 DIAGNOSIS — H1013 Acute atopic conjunctivitis, bilateral: Secondary | ICD-10-CM

## 2019-11-18 DIAGNOSIS — J302 Other seasonal allergic rhinitis: Secondary | ICD-10-CM

## 2019-11-22 DIAGNOSIS — J301 Allergic rhinitis due to pollen: Secondary | ICD-10-CM | POA: Diagnosis not present

## 2019-11-27 DIAGNOSIS — J301 Allergic rhinitis due to pollen: Secondary | ICD-10-CM | POA: Diagnosis not present

## 2019-11-29 DIAGNOSIS — J301 Allergic rhinitis due to pollen: Secondary | ICD-10-CM | POA: Diagnosis not present

## 2019-12-04 ENCOUNTER — Other Ambulatory Visit: Payer: Self-pay | Admitting: Physician Assistant

## 2019-12-04 NOTE — Telephone Encounter (Signed)
Can you call patient for follow up on pain? Thanks!

## 2019-12-04 NOTE — Telephone Encounter (Signed)
Encouraged to make appt to follow up on pain. Will send refill.

## 2019-12-04 NOTE — Telephone Encounter (Signed)
Last filled 08/13/2019 #[id number] with no refills.  Last visit 03/06/2019. Last visit states if ongoing pain will need workup. Please advise on refill.

## 2019-12-05 DIAGNOSIS — J301 Allergic rhinitis due to pollen: Secondary | ICD-10-CM | POA: Diagnosis not present

## 2019-12-05 NOTE — Telephone Encounter (Signed)
Left patient a voicemail with information below. Let patient know to call us back to schedule an appointment. 

## 2019-12-20 DIAGNOSIS — J301 Allergic rhinitis due to pollen: Secondary | ICD-10-CM | POA: Diagnosis not present

## 2019-12-27 DIAGNOSIS — J301 Allergic rhinitis due to pollen: Secondary | ICD-10-CM | POA: Diagnosis not present

## 2020-01-02 DIAGNOSIS — J301 Allergic rhinitis due to pollen: Secondary | ICD-10-CM | POA: Diagnosis not present

## 2020-01-11 ENCOUNTER — Ambulatory Visit: Payer: BLUE CROSS/BLUE SHIELD | Admitting: Physician Assistant

## 2020-01-11 ENCOUNTER — Encounter: Payer: Self-pay | Admitting: Physician Assistant

## 2020-01-11 ENCOUNTER — Ambulatory Visit (INDEPENDENT_AMBULATORY_CARE_PROVIDER_SITE_OTHER): Payer: BC Managed Care – PPO

## 2020-01-11 ENCOUNTER — Other Ambulatory Visit: Payer: Self-pay

## 2020-01-11 VITALS — BP 120/90 | HR 74 | Ht 62.25 in | Wt 174.0 lb

## 2020-01-11 DIAGNOSIS — N951 Menopausal and female climacteric states: Secondary | ICD-10-CM

## 2020-01-11 DIAGNOSIS — M545 Low back pain: Secondary | ICD-10-CM | POA: Diagnosis not present

## 2020-01-11 DIAGNOSIS — M25551 Pain in right hip: Secondary | ICD-10-CM

## 2020-01-11 DIAGNOSIS — Z1159 Encounter for screening for other viral diseases: Secondary | ICD-10-CM | POA: Diagnosis not present

## 2020-01-11 DIAGNOSIS — Z13220 Encounter for screening for lipoid disorders: Secondary | ICD-10-CM

## 2020-01-11 DIAGNOSIS — Z131 Encounter for screening for diabetes mellitus: Secondary | ICD-10-CM

## 2020-01-11 DIAGNOSIS — G8929 Other chronic pain: Secondary | ICD-10-CM

## 2020-01-11 DIAGNOSIS — G43009 Migraine without aura, not intractable, without status migrainosus: Secondary | ICD-10-CM

## 2020-01-11 DIAGNOSIS — M7071 Other bursitis of hip, right hip: Secondary | ICD-10-CM | POA: Diagnosis not present

## 2020-01-11 DIAGNOSIS — M1611 Unilateral primary osteoarthritis, right hip: Secondary | ICD-10-CM | POA: Diagnosis not present

## 2020-01-11 MED ORDER — MELOXICAM 15 MG PO TABS: 15.0000 mg | ORAL_TABLET | Freq: Every day | ORAL | 1 refills | Status: DC

## 2020-01-11 MED ORDER — METHOCARBAMOL 500 MG PO TABS: 500.0000 mg | ORAL_TABLET | Freq: Three times a day (TID) | ORAL | 0 refills | Status: DC

## 2020-01-11 MED ORDER — SUMATRIPTAN SUCCINATE 100 MG PO TABS: ORAL_TABLET | ORAL | 1 refills | Status: DC

## 2020-01-11 MED ORDER — VENLAFAXINE HCL 37.5 MG PO TABS: 37.5000 mg | ORAL_TABLET | Freq: Two times a day (BID) | ORAL | 1 refills | Status: DC

## 2020-01-11 NOTE — Patient Instructions (Signed)
Stop flexeril. Start robaxin.  Stop celebrex. Start mobic once a day.  Get xrays todays.  Start regular stretches/exericises. Consider tens unit/icy hot patches.  Start effexor for hot flashes.    Low Back Sprain or Strain Rehab Ask your health care provider which exercises are safe for you. Do exercises exactly as told by your health care provider and adjust them as directed. It is normal to feel mild stretching, pulling, tightness, or discomfort as you do these exercises. Stop right away if you feel sudden pain or your pain gets worse. Do not begin these exercises until told by your health care provider. Stretching and range-of-motion exercises These exercises warm up your muscles and joints and improve the movement and flexibility of your back. These exercises also help to relieve pain, numbness, and tingling. Lumbar rotation  1. Lie on your back on a firm surface and bend your knees. 2. Straighten your arms out to your sides so each arm forms a 90-degree angle (right angle) with a side of your body. 3. Slowly move (rotate) both of your knees to one side of your body until you feel a stretch in your lower back (lumbar). Try not to let your shoulders lift off the floor. 4. Hold this position for __________ seconds. 5. Tense your abdominal muscles and slowly move your knees back to the starting position. 6. Repeat this exercise on the other side of your body. Repeat __________ times. Complete this exercise __________ times a day. Single knee to chest  1. Lie on your back on a firm surface with both legs straight. 2. Bend one of your knees. Use your hands to move your knee up toward your chest until you feel a gentle stretch in your lower back and buttock. ? Hold your leg in this position by holding on to the front of your knee. ? Keep your other leg as straight as possible. 3. Hold this position for __________ seconds. 4. Slowly return to the starting position. 5. Repeat with your other  leg. Repeat __________ times. Complete this exercise __________ times a day. Prone extension on elbows  1. Lie on your abdomen on a firm surface (prone position). 2. Prop yourself up on your elbows. 3. Use your arms to help lift your chest up until you feel a gentle stretch in your abdomen and your lower back. ? This will place some of your body weight on your elbows. If this is uncomfortable, try stacking pillows under your chest. ? Your hips should stay down, against the surface that you are lying on. Keep your hip and back muscles relaxed. 4. Hold this position for __________ seconds. 5. Slowly relax your upper body and return to the starting position. Repeat __________ times. Complete this exercise __________ times a day. Strengthening exercises These exercises build strength and endurance in your back. Endurance is the ability to use your muscles for a long time, even after they get tired. Pelvic tilt This exercise strengthens the muscles that lie deep in the abdomen. 1. Lie on your back on a firm surface. Bend your knees and keep your feet flat on the floor. 2. Tense your abdominal muscles. Tip your pelvis up toward the ceiling and flatten your lower back into the floor. ? To help with this exercise, you may place a small towel under your lower back and try to push your back into the towel. 3. Hold this position for __________ seconds. 4. Let your muscles relax completely before you repeat this exercise. Repeat  __________ times. Complete this exercise __________ times a day. Alternating arm and leg raises  1. Get on your hands and knees on a firm surface. If you are on a hard floor, you may want to use padding, such as an exercise mat, to cushion your knees. 2. Line up your arms and legs. Your hands should be directly below your shoulders, and your knees should be directly below your hips. 3. Lift your left leg behind you. At the same time, raise your right arm and straighten it in  front of you. ? Do not lift your leg higher than your hip. ? Do not lift your arm higher than your shoulder. ? Keep your abdominal and back muscles tight. ? Keep your hips facing the ground. ? Do not arch your back. ? Keep your balance carefully, and do not hold your breath. 4. Hold this position for __________ seconds. 5. Slowly return to the starting position. 6. Repeat with your right leg and your left arm. Repeat __________ times. Complete this exercise __________ times a day. Abdominal set with straight leg raise  1. Lie on your back on a firm surface. 2. Bend one of your knees and keep your other leg straight. 3. Tense your abdominal muscles and lift your straight leg up, 4-6 inches (10-15 cm) off the ground. 4. Keep your abdominal muscles tight and hold this position for __________ seconds. ? Do not hold your breath. ? Do not arch your back. Keep it flat against the ground. 5. Keep your abdominal muscles tense as you slowly lower your leg back to the starting position. 6. Repeat with your other leg. Repeat __________ times. Complete this exercise __________ times a day. Single leg lower with bent knees 1. Lie on your back on a firm surface. 2. Tense your abdominal muscles and lift your feet off the floor, one foot at a time, so your knees and hips are bent in 90-degree angles (right angles). ? Your knees should be over your hips and your lower legs should be parallel to the floor. 3. Keeping your abdominal muscles tense and your knee bent, slowly lower one of your legs so your toe touches the ground. 4. Lift your leg back up to return to the starting position. ? Do not hold your breath. ? Do not let your back arch. Keep your back flat against the ground. 5. Repeat with your other leg. Repeat __________ times. Complete this exercise __________ times a day. Posture and body mechanics Good posture and healthy body mechanics can help to relieve stress in your body's tissues and  joints. Body mechanics refers to the movements and positions of your body while you do your daily activities. Posture is part of body mechanics. Good posture means:  Your spine is in its natural S-curve position (neutral).  Your shoulders are pulled back slightly.  Your head is not tipped forward. Follow these guidelines to improve your posture and body mechanics in your everyday activities. Standing   When standing, keep your spine neutral and your feet about hip width apart. Keep a slight bend in your knees. Your ears, shoulders, and hips should line up.  When you do a task in which you stand in one place for a long time, place one foot up on a stable object that is 2-4 inches (5-10 cm) high, such as a footstool. This helps keep your spine neutral. Sitting   When sitting, keep your spine neutral and keep your feet flat on the floor. Use  a footrest, if necessary, and keep your thighs parallel to the floor. Avoid rounding your shoulders, and avoid tilting your head forward.  When working at a desk or a computer, keep your desk at a height where your hands are slightly lower than your elbows. Slide your chair under your desk so you are close enough to maintain good posture.  When working at a computer, place your monitor at a height where you are looking straight ahead and you do not have to tilt your head forward or downward to look at the screen. Resting  When lying down and resting, avoid positions that are most painful for you.  If you have pain with activities such as sitting, bending, stooping, or squatting, lie in a position in which your body does not bend very much. For example, avoid curling up on your side with your arms and knees near your chest (fetal position).  If you have pain with activities such as standing for a long time or reaching with your arms, lie with your spine in a neutral position and bend your knees slightly. Try the following positions: ? Lying on your side  with a pillow between your knees. ? Lying on your back with a pillow under your knees. Lifting   When lifting objects, keep your feet at least shoulder width apart and tighten your abdominal muscles.  Bend your knees and hips and keep your spine neutral. It is important to lift using the strength of your legs, not your back. Do not lock your knees straight out.  Always ask for help to lift heavy or awkward objects. This information is not intended to replace advice given to you by your health care provider. Make sure you discuss any questions you have with your health care provider. Document Revised: 03/23/2019 Document Reviewed: 12/21/2018 Elsevier Patient Education  2020 [employer]  Hip Bursitis  Hip bursitis is swelling of a fluid-filled sac (bursa) in your hip joint. This swelling (inflammation) can be painful. This condition may come and go over time. What are the causes?  Injury to the hip.  Overuse of the muscles that surround the hip joint.  An earlier injury or surgery of the hip.  Arthritis or gout.  Diabetes.  Thyroid disease.  Infection.  In some cases, the cause may not be known. What are the signs or symptoms?  Mild or moderate pain in the hip area. Pain may get worse with movement.  Tenderness and swelling of the hip, especially on the outer side of the hip.  In rare cases, the bursa may become infected. This may cause: ? A fever. ? Warmth and redness in the area. Symptoms may come and go. How is this treated? This condition is treated by resting, icing, applying pressure (compression), and raising (elevating) the injured area. You may hear this called the RICE treatment. Treatment may also include:  Using crutches.  Draining fluid out of the bursa to help relieve swelling.  Giving a shot of (injecting) medicine that helps to reduce swelling (cortisone).  Other medicines if the bursa is infected. Follow these instructions at home: Managing  pain, stiffness, and swelling   If told, put ice on the painful area. ? Put ice in a plastic bag. ? Place a towel between your skin and the bag. ? Leave the ice on for 20 minutes, 2-3 times a day. ? Raise (elevate) your hip above the level of your heart as much as you can without pain. To do this,  try putting a pillow under your hips while you lie down. Stop if this causes pain. Activity  Return to your normal activities as told by your doctor. Ask your doctor what activities are safe for you.  Rest and protect your hip as much as you can until you feel better. General instructions  Take over-the-counter and prescription medicines only as told by your doctor.  Wear wraps that put pressure on your hip (compression wraps) only as told by your doctor.  Do not use your hip to support your body weight until your doctor says that you can.  Use crutches as told by your doctor.  Gently rub and stretch your injured area as often as is comfortable.  Keep all follow-up visits as told by your doctor. This is important. How is this prevented?  Exercise regularly, as told by your doctor.  Warm up and stretch before being active.  Cool down and stretch after being active.  Avoid activities that bother your hip or cause pain.  Avoid sitting down for long periods at a time. Contact a doctor if:  You have a fever.  You get new symptoms.  You have trouble walking.  You have trouble doing everyday activities.  You have pain that gets worse.  You have pain that does not get better with medicine.  You get red skin on your hip area.  You get a feeling of warmth in your hip area. Get help right away if:  You cannot move your hip.  You have very bad pain. Summary  Hip bursitis is swelling of a fluid-filled sac (bursa) in your hip.  Hip bursitis can be painful.  Symptoms often come and go over time.  This condition is treated with rest, ice, compression, elevation, and  medicines. This information is not intended to replace advice given to you by your health care provider. Make sure you discuss any questions you have with your health care provider. Document Revised: 08/07/2018 Document Reviewed: 08/07/2018 Elsevier Patient Education  2020 [employer].

## 2020-01-11 NOTE — Progress Notes (Signed)
Jodi Perry of back shows a lot of arthritis. This is likely causing some of your back pain/muscle spasms. Treatment plan stays the same.

## 2020-01-11 NOTE — Progress Notes (Signed)
Acute Office Visit  Subjective:    Patient ID: Jodi Perry, female    DOB: [birth year], 59 y.o.   MRN: [id number]  Chief Complaint  Patient presents with  . Back Pain  . Hip Pain    HPI Patient is in today for med check and back pain. Back pain began a year ago after she "threw it out planting grass". Pain is on top of femur and low back,she states it is hard to get up in the morning. R Hip hurts in deep gluteal and trochanteric bursa area. When pain radiates it spasms up to the bra line, pain never radiates down the leg and there is no numbness or tingling. Pt reports pain with wearing a belt It hurts to lay on that side and prevents her from sleeping. She was given Celebrex but that does not touch the pain. Pt denies any recent falls or trouble walking but states that her ROM is limited d/t pain. Sometimes she hears a "pop" in her hip but it is not associated with pain. 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 She states she can not put pressure on that side d/t pain.  Went to emergency clinic 11/2018 bc of back pain, gave mm relaxers and those helped  No SE, no new meds, takes Vit D, Zinc, drinks milk  She mentions worsening hot flashes. She wonders if any treatment.   Migraines controlled.   Past Medical History:  Diagnosis Date  . BPPV (benign paroxysmal positional vertigo) 07/15/2015  . Depression 10/29/2014  . Migraine without aura and without status migrainosus, not intractable 10/25/2014    Past Surgical History:  Procedure Laterality Date  . TUBAL LIGATION      Family History  Problem Relation Age of Onset  . Emphysema Mother   . Heart attack Father   . Diabetes Sister     Social History   Socioeconomic History  . Marital status: Married    Spouse name: Not on file  . Number of children: Not on file  . Years of education: Not on file  . Highest education level: Not  on file  Occupational History  . Not on file  Tobacco Use  . Smoking status: Never Smoker  . Smokeless tobacco: Never Used  Substance and Sexual Activity  . Alcohol use: No    Alcohol/week: 0.0 standard drinks  . Drug use: No  . Sexual activity: Never  Other Topics Concern  . Not on file  Social History Narrative  . Not on file   Social Determinants of Health   Financial Resource Strain:   . Difficulty of Paying Living Expenses: Not on file  Food Insecurity:   . Worried About Programme researcher, broadcasting/film/video in the Last Year: Not on file  . Ran Out of Food in the Last Year: Not on file  Transportation Needs:   . Lack of Transportation (Medical): Not on file  . Lack of Transportation (Non-Medical): Not on file  Physical Activity:   . Days of Exercise per Week: Not on file  . Minutes of Exercise per Session: Not on file  Stress:   . Feeling of Stress : Not on file  Social Connections:   . Frequency of Communication with Friends and Family: Not on file  . Frequency of Social Gatherings with Friends and Family: Not on file  . Attends Religious Services: Not on file  .  Active Member of Clubs or Organizations: Not on file  . Attends Archivist Meetings: Not on file  . Marital Status: Not on file  Intimate Partner Violence:   . Fear of Current or Ex-Partner: Not on file  . Emotionally Abused: Not on file  . Physically Abused: Not on file  . Sexually Abused: Not on file    Outpatient Medications Prior to Visit  Medication Sig Dispense Refill  . azelastine (OPTIVAR) 0.05 % ophthalmic solution INSTILL 1 DROP INTO BOTH EYES TWICE A DAY 18 mL 1  . fexofenadine (ALLEGRA) 180 MG tablet TAKE 1 TABLET BY MOUTH EVERY DAY 90 tablet 1  . fluticasone (FLONASE) 50 MCG/ACT nasal spray Place 2 sprays into both nostrils daily. 16 g 5  . hydrOXYzine (ATARAX/VISTARIL) 10 MG tablet Take 1 tablet (10 mg total) by mouth 3 (three) times daily as needed. 90 tablet 0  . levocetirizine (XYZAL) 5 MG  tablet Take 1 tablet (5 mg total) by mouth every evening. 90 tablet 3  . Polyvinyl Alcohol-Povidone (REFRESH OP) Apply to eye.    . celecoxib (CELEBREX) 200 MG capsule TAKE 1 TO 2 CAPSULES BY MOUTH EVERY DAY AS NEEDED FOR PAIN 180 capsule 0  . cyclobenzaprine (FLEXERIL) 10 MG tablet Take 1 tablet (10 mg total) by mouth 3 (three) times daily as needed for muscle spasms. 30 tablet 0  . SUMAtriptan (IMITREX) 100 MG tablet 1 TAB AS NEEDED FOR MIGRAINE MAY REPEAT IN 2 HRS IF PERSISTS 12 tablet 1   No facility-administered medications prior to visit.    Allergies  Allergen Reactions  . Contrave [Naltrexone-Bupropion Hcl Er]     Trigger migraines.   . Hydrocodone   . Topamax [Topiramate]     Feel weird    Review of Systems Pt denies any recent sick contacts, fvr, chills, malaise MSK: Pt denies numbness or tingling. Pt states pain radiates up to bra line w/ spasms.    Objective:    Physical Exam Pt was tender to palpation over the trochanteric bursa. Pt complained of pain over the bilateral SI joints. Pt had 5/5 strength overall and no deficits in ROM. Reflexes 2 + bilaterally. Straight leg raise and Ober's test were negative. When pt wa placed in figure 4 position she complained of pain in the piriformis area. BP 120/90   Pulse 74   Ht 5' 2.25" (1.581 m)   Wt 174 lb (78.9 kg)   SpO2 99%   BMI 31.57 kg/m  Wt Readings from Last 3 Encounters:  01/11/20 174 lb (78.9 kg)  08/29/18 174 lb (78.9 kg)  07/06/18 172 lb (78 kg)      Assessment & Plan:  Jodi KitchenMarland Perry was seen today for back pain and hip pain.  Diagnoses and all orders for this visit:  Acute right hip pain -     DG Hip Unilat W OR W/O Pelvis 2-3 Views Right -     methocarbamol (ROBAXIN) 500 MG tablet; Take 1 tablet (500 mg total) by mouth 3 (three) times daily. -     meloxicam (MOBIC) 15 MG tablet; Take 1 tablet (15 mg total) by mouth daily.  Migraine without aura and without status migrainosus, not intractable -      SUMAtriptan (IMITREX) 100 MG tablet; 1 TAB AS NEEDED FOR MIGRAINE MAY REPEAT IN 2 HRS IF PERSISTS  Hot flashes, menopausal -     FSH/LH -     Estrogens, total -     Progesterone -  venlafaxine (EFFEXOR) 37.5 MG tablet; Take 1 tablet (37.5 mg total) by mouth 2 (two) times daily.  Encounter for hepatitis C screening test for low risk patient -     Hepatitis C Antibody  Screening for lipid disorders -     Lipid Panel w/reflex Direct LDL  Screening for diabetes mellitus -     COMPLETE METABOLIC PANEL WITH GFR  Chronic bilateral low back pain without sciatica -     DG Lumbar Spine Complete -     methocarbamol (ROBAXIN) 500 MG tablet; Take 1 tablet (500 mg total) by mouth 3 (three) times daily. -     meloxicam (MOBIC) 15 MG tablet; Take 1 tablet (15 mg total) by mouth daily.   Medications refilled.  Need screening labs.  Ordered hormone panel for hot flashes. Start effexor as non hormonal option due to increased risk of HRT. Follow up in 4 weeks.   Get xrays today.  Low back pain seems more lumbar DDD and muscle involvment.  Start exercises and stretches for low back.  Muscle relaxer given. Replace flexeril with robaxin.  Stop celebrex and start mobic.  Consider tens unit/icy hot patches.  Form PT if not improving.  Follow up in 4 weeks.   Right hip bursitis. Injection given today. Symptomatic care discussed.   Injection Procedure Note  Procedure: Injection Indications: pain  Procedure Details Consent: Risks of procedure as well as the alternatives and risks of each were explained to the (patient/caregiver).  Consent for procedure obtained. Time Out: Verified patient identification, verified procedure, site/side was marked, verified correct patient position, special equipment/implants available, medications/allergies/relevent history reviewed, required imaging and test results available.  Performed   Local Anesthesia Used:Ethyl Chloride Spray Injected 40mg  of depo medrol  and 39mL of 2 percent lidocaine without epi into lateral greater trochanteric bursa A sterile dressing was applied.  Patient did tolerate procedure well.  10m  .Tandy Gaw PA-C, have reviewed and agree with the above documentation in it's entirety.

## 2020-01-11 NOTE — Progress Notes (Signed)
No acute changes of right hip but there is mild arthritis. Hopefully mobic will help with that. If right hip pain continues consider injection in joint.

## 2020-01-14 ENCOUNTER — Encounter: Payer: Self-pay | Admitting: Physician Assistant

## 2020-01-14 DIAGNOSIS — M25551 Pain in right hip: Secondary | ICD-10-CM | POA: Insufficient documentation

## 2020-01-14 DIAGNOSIS — M545 Low back pain, unspecified: Secondary | ICD-10-CM | POA: Insufficient documentation

## 2020-01-14 DIAGNOSIS — G8929 Other chronic pain: Secondary | ICD-10-CM | POA: Insufficient documentation

## 2020-01-16 DIAGNOSIS — J301 Allergic rhinitis due to pollen: Secondary | ICD-10-CM | POA: Diagnosis not present

## 2020-01-18 ENCOUNTER — Other Ambulatory Visit: Payer: Self-pay | Admitting: Physician Assistant

## 2020-01-18 DIAGNOSIS — Z13220 Encounter for screening for lipoid disorders: Secondary | ICD-10-CM | POA: Diagnosis not present

## 2020-01-18 DIAGNOSIS — N951 Menopausal and female climacteric states: Secondary | ICD-10-CM | POA: Diagnosis not present

## 2020-01-18 DIAGNOSIS — Z1159 Encounter for screening for other viral diseases: Secondary | ICD-10-CM | POA: Diagnosis not present

## 2020-01-18 DIAGNOSIS — Z131 Encounter for screening for diabetes mellitus: Secondary | ICD-10-CM | POA: Diagnosis not present

## 2020-01-22 ENCOUNTER — Encounter: Payer: Self-pay | Admitting: Physician Assistant

## 2020-01-22 DIAGNOSIS — E7800 Pure hypercholesterolemia, unspecified: Secondary | ICD-10-CM | POA: Insufficient documentation

## 2020-01-22 NOTE — Progress Notes (Signed)
Madesyn,   Cholesterol good. HDL great. LDL not optimal. 10 year CV risk is 2.2 percent under what is needed for medication treatment.  Kidney, liver, glucose looks great.  Hep C negative.  Definitely in menopause.  Progesterone is low and waiting on estrogen.

## 2020-01-23 DIAGNOSIS — J301 Allergic rhinitis due to pollen: Secondary | ICD-10-CM | POA: Diagnosis not present

## 2020-01-24 LAB — COMPLETE METABOLIC PANEL WITH GFR
AG Ratio: 1.5 (calc) (ref 1.0–2.5)
ALT: 11 U/L (ref 6–29)
AST: 15 U/L (ref 10–35)
Albumin: 4.3 g/dL (ref 3.6–5.1)
Alkaline phosphatase (APISO): 93 U/L (ref 37–153)
BUN: 13 mg/dL (ref 7–25)
CO2: 29 mmol/L (ref 20–32)
Calcium: 9.8 mg/dL (ref 8.6–10.4)
Chloride: 104 mmol/L (ref 98–110)
Creat: 0.62 mg/dL (ref 0.50–1.05)
GFR, Est African American: 116 mL/min/{1.73_m2} (ref >=60)
GFR, Est Non African American: 100 mL/min/{1.73_m2} (ref >=60)
Globulin: 2.8 g/dL (calc) (ref 1.9–3.7)
Glucose, Bld: 96 mg/dL (ref 65–99)
Potassium: 4.7 mmol/L (ref 3.5–5.3)
Sodium: 140 mmol/L (ref 135–146)
Total Bilirubin: 0.5 mg/dL (ref 0.2–1.2)
Total Protein: 7.1 g/dL (ref 6.1–8.1)

## 2020-01-24 LAB — LIPID PANEL W/REFLEX DIRECT LDL
Cholesterol: 206 mg/dL — ABNORMAL HIGH (ref <200)
HDL: 62 mg/dL (ref >=50)
LDL Cholesterol (Calc): 128 mg/dL (calc) — ABNORMAL HIGH
Non-HDL Cholesterol (Calc): 144 mg/dL (calc) — ABNORMAL HIGH (ref <130)
Total CHOL/HDL Ratio: 3.3 (calc) (ref <5.0)
Triglycerides: 69 mg/dL (ref <150)

## 2020-01-24 LAB — FSH/LH
FSH: 69.6 m[IU]/mL
LH: 22.8 m[IU]/mL

## 2020-01-24 LAB — HEPATITIS C ANTIBODY
Hepatitis C Ab: NONREACTIVE
SIGNAL TO CUT-OFF: 0.01 (ref <1.00)

## 2020-01-24 LAB — ESTROGENS, TOTAL: Estrogen: 118.5 pg/mL

## 2020-01-24 LAB — PROGESTERONE: Progesterone: <0.5 ng/mL

## 2020-01-25 NOTE — Progress Notes (Signed)
Estrogen is lower but not non -existent. Hopefully the effexor will help with hot flashes.

## 2020-02-04 IMAGING — MG DIGITAL SCREENING BILATERAL MAMMOGRAM WITH TOMO AND CAD
7 series · 8 of 19 positions shown · non-contrast
Comparison: None.

ACR Breast Density Category a: The breast tissue is almost entirely
fatty.

CLINICAL DATA: Screening.

EXAM:
DIGITAL SCREENING BILATERAL MAMMOGRAM WITH TOMO AND CAD

[L MLO synth-2D]
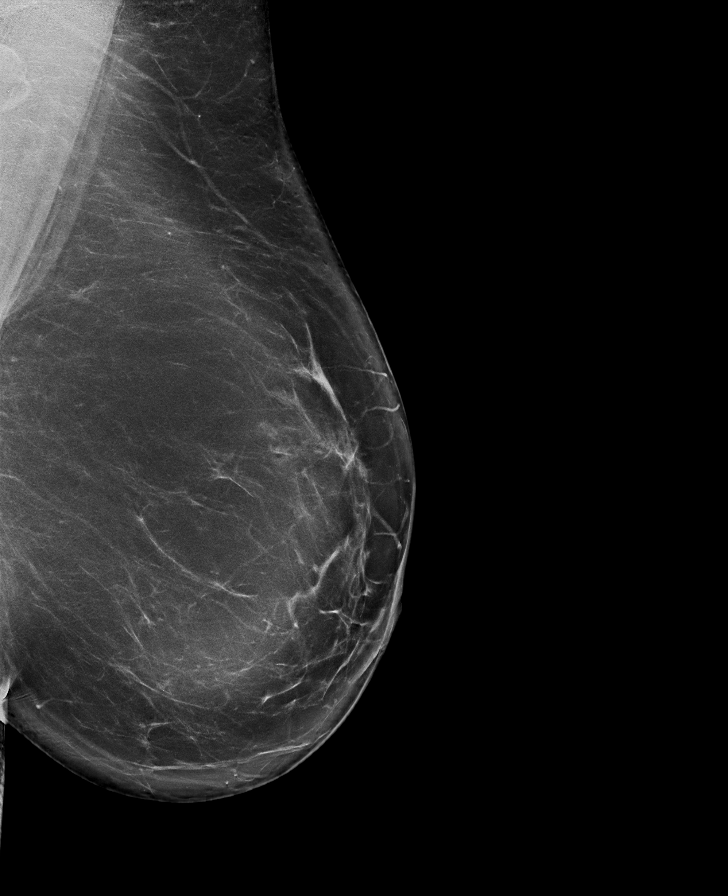

[R MLO synth-2D]
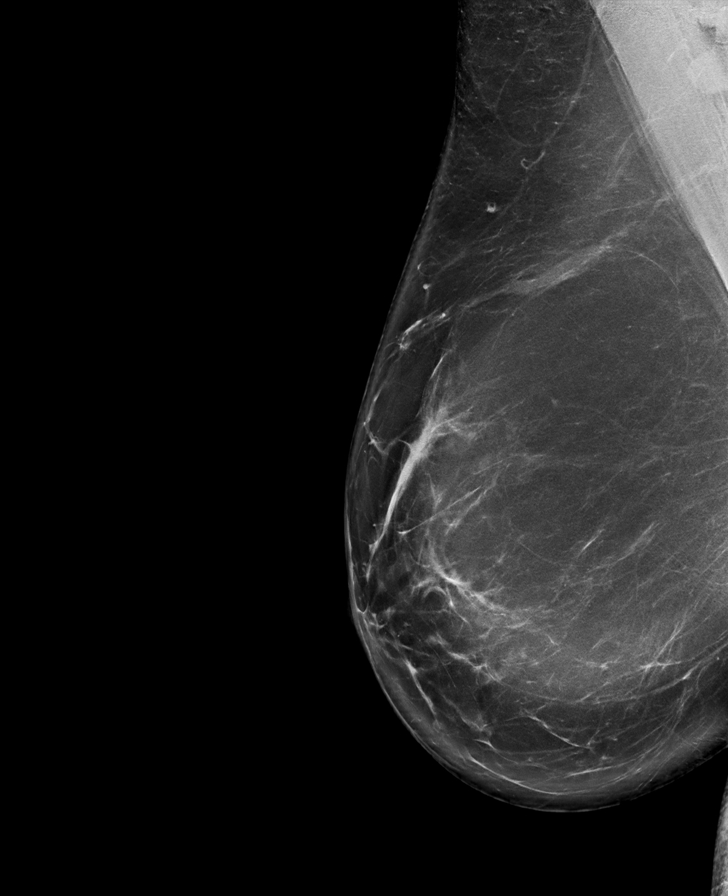

[L CC synth-2D]
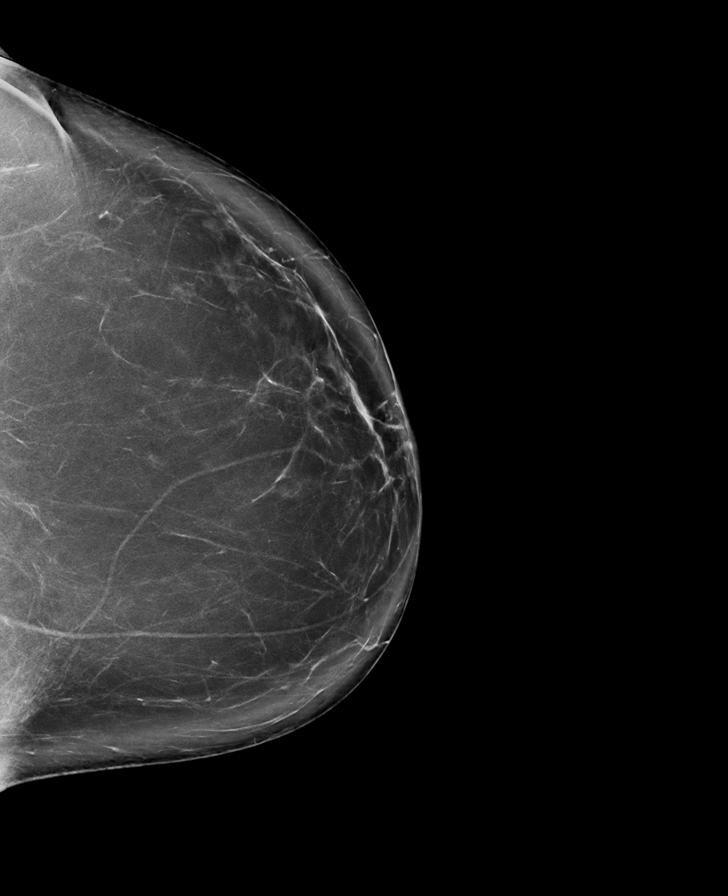

[R CC synth-2D]
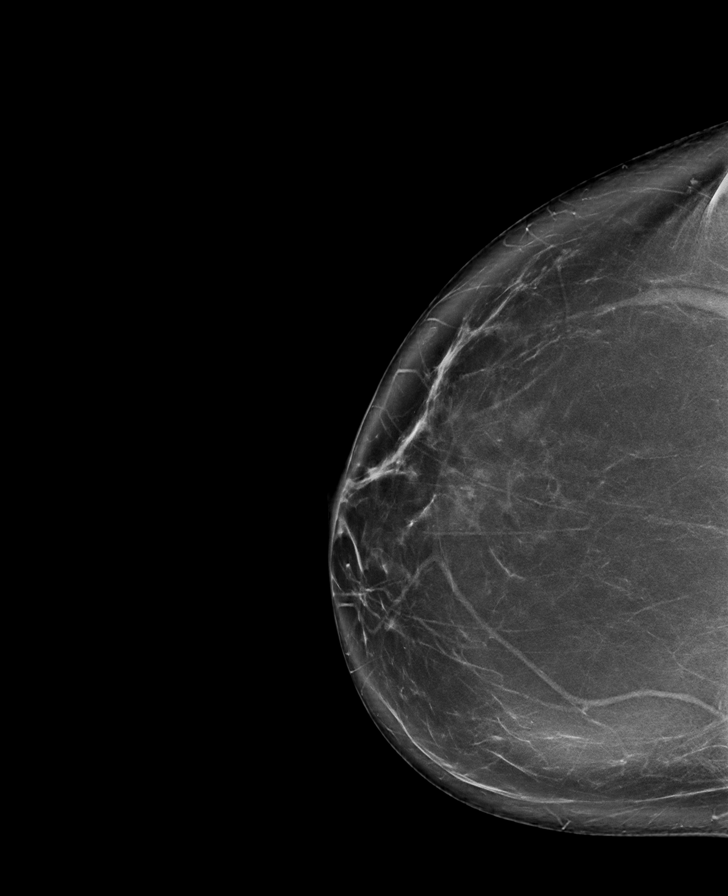

[L CC tomo · 2 of 99 frames shown]
[frame 32/99]
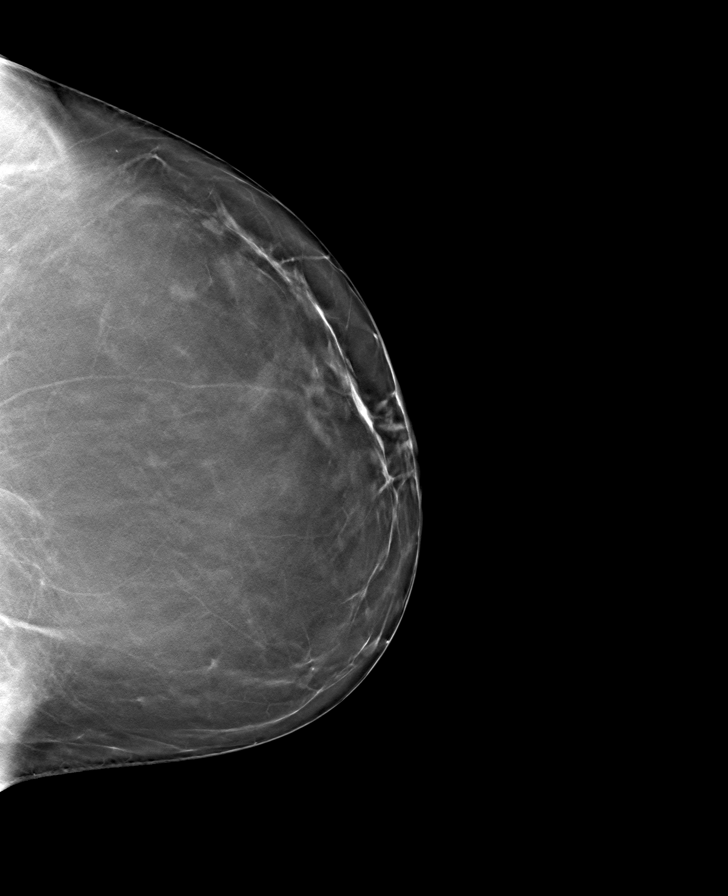
[frame 50/99]
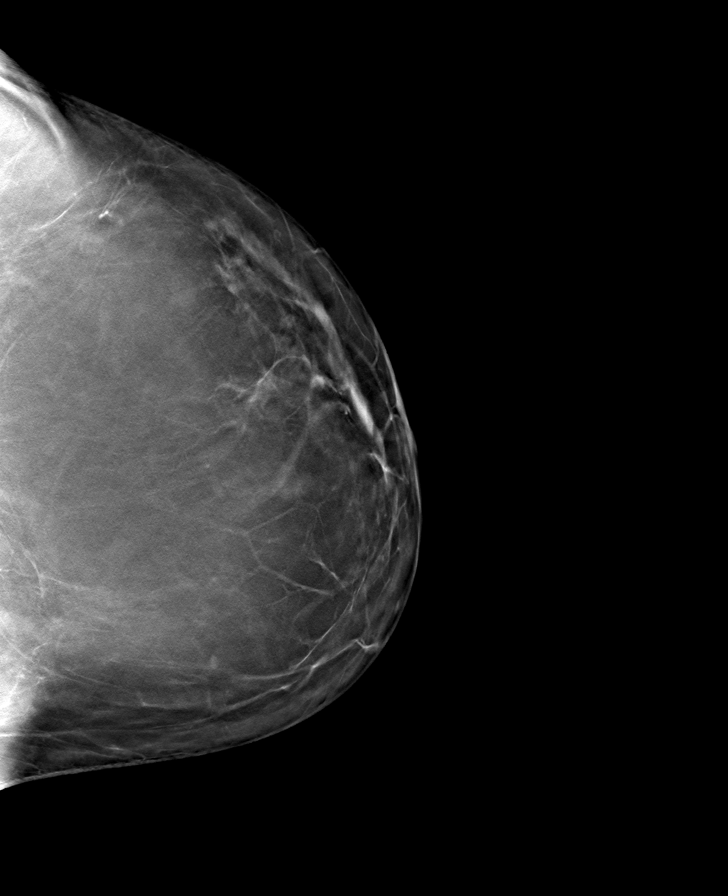

[L MLO tomo · tomo slice 51/102.0]
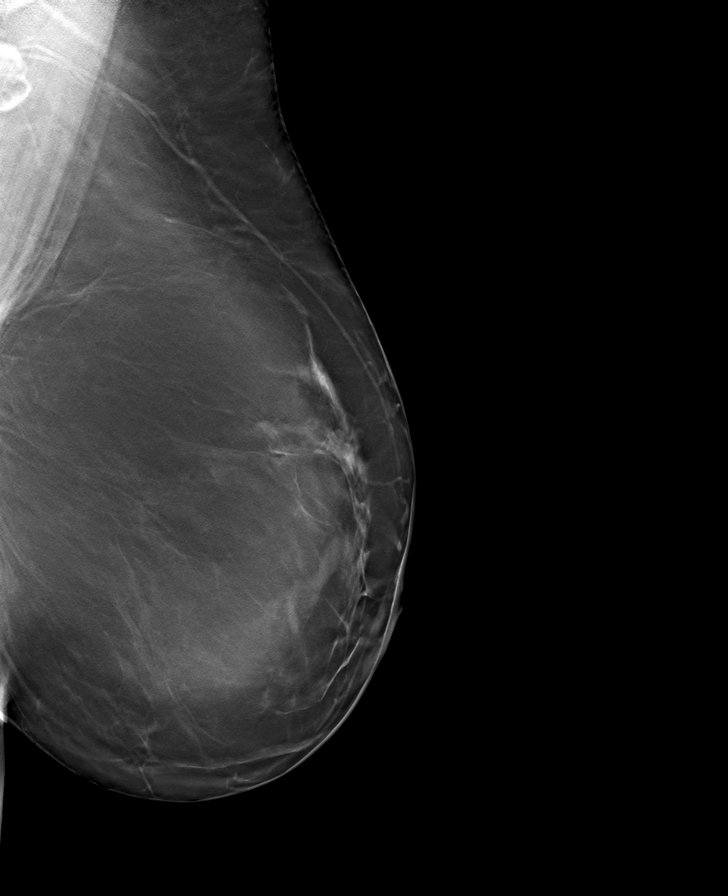

[R CC tomo · tomo slice 46/91.0]
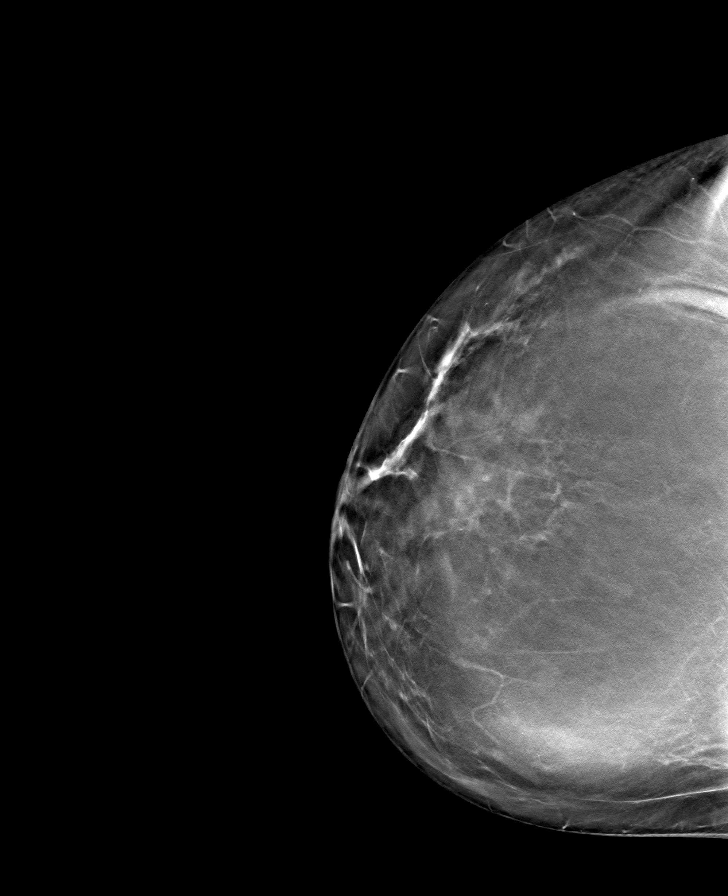

[8 of 19 positions shown; findings below may reference images not displayed]

FINDINGS: There are no findings suspicious for malignancy. Images were
processed with CAD.
IMPRESSION: No mammographic evidence of malignancy. A result letter of this
screening mammogram will be mailed directly to the patient.

RECOMMENDATION:
Screening mammogram in one year. (Code:[id number])

BI-RADS CATEGORY  1: Negative.

## 2020-02-06 DIAGNOSIS — J301 Allergic rhinitis due to pollen: Secondary | ICD-10-CM | POA: Diagnosis not present

## 2020-02-14 DIAGNOSIS — J301 Allergic rhinitis due to pollen: Secondary | ICD-10-CM | POA: Diagnosis not present

## 2020-02-21 DIAGNOSIS — J301 Allergic rhinitis due to pollen: Secondary | ICD-10-CM | POA: Diagnosis not present

## 2020-02-22 ENCOUNTER — Encounter: Payer: Self-pay | Admitting: Physician Assistant

## 2020-02-22 ENCOUNTER — Other Ambulatory Visit: Payer: Self-pay

## 2020-02-22 ENCOUNTER — Ambulatory Visit: Payer: BC Managed Care – PPO | Admitting: Physician Assistant

## 2020-02-22 VITALS — BP 133/83 | HR 65 | Ht 62.25 in | Wt 174.0 lb

## 2020-02-22 DIAGNOSIS — G8929 Other chronic pain: Secondary | ICD-10-CM

## 2020-02-22 DIAGNOSIS — E6609 Other obesity due to excess calories: Secondary | ICD-10-CM | POA: Diagnosis not present

## 2020-02-22 DIAGNOSIS — M545 Low back pain: Secondary | ICD-10-CM

## 2020-02-22 DIAGNOSIS — Z6831 Body mass index (BMI) 31.0-31.9, adult: Secondary | ICD-10-CM

## 2020-02-22 DIAGNOSIS — E66811 Obesity, class 1: Secondary | ICD-10-CM

## 2020-02-22 DIAGNOSIS — M7061 Trochanteric bursitis, right hip: Secondary | ICD-10-CM | POA: Diagnosis not present

## 2020-02-22 MED ORDER — MELOXICAM 15 MG PO TABS: 15.0000 mg | ORAL_TABLET | Freq: Every day | ORAL | 2 refills | Status: DC

## 2020-02-25 NOTE — Progress Notes (Signed)
Subjective:    Patient ID: Jodi Perry, female    DOB: 04-Nov-1961, 59 y.o.   MRN: [id number]  HPI Pt is a 59 yo obese female who presents to the clinic to follow up on right hip pain and low back pain. She is doing much better. She has little to no pain. She is taking mobic and helping a lot more. She continues to have intermittent back pain with lifting at work.   She would like something to help with weight loss. She has used phentermine in the past and done well.   .. Active Ambulatory Problems    Diagnosis Date Noted  . Migraine without aura and without status migrainosus, not intractable 10/25/2014  . Class 1 obesity due to excess calories without serious comorbidity with body mass index (BMI) of 31.0 to 31.9 in adult 10/29/2014  . Anxiety 10/29/2014  . BMI 30.0-30.9,adult 03/03/2015  . Seborrheic keratoses 08/01/2015  . 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 Allergic conjunctivitis of both eyes 12/02/2017  . Seasonal allergies 08/29/2018  . Elevated blood pressure reading 08/29/2018  . Acute right hip pain 01/14/2020  . Chronic bilateral low back pain without sciatica 01/14/2020  . Elevated LDL cholesterol level 01/22/2020   Resolved Ambulatory Problems    Diagnosis Date Noted  . MIGRAINE HEADACHE 09/08/2010  . Depression 10/29/2014  . Abnormal weight gain 10/29/2014  . Tetanus toxoid vaccination within the past 10 years 03/05/2015  . BPPV (benign paroxysmal positional vertigo) 07/15/2015  . Thumb pain 12/30/2015   No Additional Past Medical History      Review of Systems  All other systems reviewed and are negative.      Objective:   Physical Exam Vitals reviewed.  Constitutional:      Appearance: Normal appearance. She is obese.  Cardiovascular:     Rate and Rhythm: Normal rate and regular rhythm.     Pulses: Normal pulses.     Heart sounds: Normal heart sounds.  Pulmonary:     Effort: Pulmonary effort is normal.     Breath sounds: Normal breath  sounds.  Musculoskeletal:     Comments: No tenderness to palpation over bilateral hips.  NROM at waist.  No tenderness over lumbar spine to palpation.   Neurological:     General: No focal deficit present.     Mental Status: She is alert and oriented to person, place, and time.  Psychiatric:        Mood and Affect: Mood normal.           Assessment & Plan:  Marland KitchenMarland KitchenIsra was seen today for follow-up.  Diagnoses and all orders for this visit:  Greater trochanteric bursitis of right hip -     meloxicam (MOBIC) 15 MG tablet; Take 1 tablet (15 mg total) by mouth daily.  Chronic bilateral low back pain without sciatica -     meloxicam (MOBIC) 15 MG tablet; Take 1 tablet (15 mg total) by mouth daily.  Class 1 obesity due to excess calories without serious comorbidity with body mass index (BMI) of 31.0 to 31.9 in adult   Continue with mobic as needed.  Discussed to continue exercises and icing after active day.  Continue to work on weight loss.  Marland Kitchen.Discussed low carb diet with 1500 calories and 80g of protein.  Exercising at least 150 minutes a week.  [employer] could be a Chief Technology Officer.  Discussed IF 16:8.  Discussed phentermine and how a short term fix and reality of gaining weight  back.

## 2020-02-27 DIAGNOSIS — J301 Allergic rhinitis due to pollen: Secondary | ICD-10-CM | POA: Diagnosis not present

## 2020-03-04 ENCOUNTER — Other Ambulatory Visit: Payer: Self-pay | Admitting: Physician Assistant

## 2020-03-06 ENCOUNTER — Ambulatory Visit: Payer: BC Managed Care – PPO | Attending: Internal Medicine

## 2020-03-06 DIAGNOSIS — Z23 Encounter for immunization: Secondary | ICD-10-CM

## 2020-03-06 NOTE — Progress Notes (Signed)
   Covid-19 Vaccination Clinic  Name:  Jodi Perry    MRN: [id number] DOB: 11-22-1961  03/06/2020  [name] was observed post Covid-19 immunization for 15 minutes without incident. She was provided with Vaccine Information Sheet and instruction to access the V-Safe system.   [name] was instructed to call 911 with any severe reactions post vaccine: Marland Kitchen Difficulty breathing  . Swelling of face and throat  . A fast heartbeat  . A bad rash all over body  . Dizziness and weakness   Immunizations Administered    Name Date Dose VIS Date Route   Pfizer COVID-19 Vaccine 03/06/2020  2:57 PM 0.3 mL 11/23/2019 Intramuscular   Manufacturer: [employer], [employer]   Lot: [id number]   NDC: [address]-6

## 2020-03-12 DIAGNOSIS — J301 Allergic rhinitis due to pollen: Secondary | ICD-10-CM | POA: Diagnosis not present

## 2020-03-19 DIAGNOSIS — J301 Allergic rhinitis due to pollen: Secondary | ICD-10-CM | POA: Diagnosis not present

## 2020-03-26 DIAGNOSIS — J301 Allergic rhinitis due to pollen: Secondary | ICD-10-CM | POA: Diagnosis not present

## 2020-03-27 ENCOUNTER — Other Ambulatory Visit: Payer: Self-pay | Admitting: Physician Assistant

## 2020-03-27 DIAGNOSIS — N951 Menopausal and female climacteric states: Secondary | ICD-10-CM

## 2020-03-31 ENCOUNTER — Ambulatory Visit: Payer: BC Managed Care – PPO | Attending: Internal Medicine

## 2020-03-31 DIAGNOSIS — Z23 Encounter for immunization: Secondary | ICD-10-CM

## 2020-03-31 NOTE — Progress Notes (Signed)
   Covid-19 Vaccination Clinic  Name:  Vanisha Whiten    MRN: [id number] DOB: [birth year]  03/31/2020  Ms. Frankowski was observed post Covid-19 immunization for 15 minutes without incident. She was provided with Vaccine Information Sheet and instruction to access the V-Safe system.   Ms. Ventresca was instructed to call 911 with any severe reactions post vaccine: Marland Kitchen Difficulty breathing  . Swelling of face and throat  . A fast heartbeat  . A bad rash all over body  . Dizziness and weakness   Immunizations Administered    Name Date Dose VIS Date Route   Pfizer COVID-19 Vaccine 03/31/2020 12:20 PM 0.3 mL 02/06/2019 Intramuscular   Manufacturer: [employer], [employer]   Lot: [id number]   NDC: [address]-2

## 2020-04-09 DIAGNOSIS — J301 Allergic rhinitis due to pollen: Secondary | ICD-10-CM | POA: Diagnosis not present

## 2020-04-10 DIAGNOSIS — J301 Allergic rhinitis due to pollen: Secondary | ICD-10-CM | POA: Diagnosis not present

## 2020-04-16 ENCOUNTER — Other Ambulatory Visit: Payer: Self-pay | Admitting: Physician Assistant

## 2020-04-16 DIAGNOSIS — G8929 Other chronic pain: Secondary | ICD-10-CM

## 2020-04-16 DIAGNOSIS — M25551 Pain in right hip: Secondary | ICD-10-CM

## 2020-04-16 DIAGNOSIS — J301 Allergic rhinitis due to pollen: Secondary | ICD-10-CM | POA: Diagnosis not present

## 2020-04-16 DIAGNOSIS — M545 Low back pain: Secondary | ICD-10-CM

## 2020-04-23 DIAGNOSIS — J3089 Other allergic rhinitis: Secondary | ICD-10-CM | POA: Diagnosis not present

## 2020-05-01 DIAGNOSIS — J301 Allergic rhinitis due to pollen: Secondary | ICD-10-CM | POA: Diagnosis not present

## 2020-05-07 DIAGNOSIS — J301 Allergic rhinitis due to pollen: Secondary | ICD-10-CM | POA: Diagnosis not present

## 2020-05-14 ENCOUNTER — Other Ambulatory Visit: Payer: Self-pay | Admitting: Physician Assistant

## 2020-05-14 DIAGNOSIS — H1013 Acute atopic conjunctivitis, bilateral: Secondary | ICD-10-CM

## 2020-05-14 DIAGNOSIS — J302 Other seasonal allergic rhinitis: Secondary | ICD-10-CM

## 2020-05-15 DIAGNOSIS — J301 Allergic rhinitis due to pollen: Secondary | ICD-10-CM | POA: Diagnosis not present

## 2020-05-21 DIAGNOSIS — J301 Allergic rhinitis due to pollen: Secondary | ICD-10-CM | POA: Diagnosis not present

## 2020-05-23 ENCOUNTER — Other Ambulatory Visit: Payer: Self-pay | Admitting: Physician Assistant

## 2020-05-23 DIAGNOSIS — M7061 Trochanteric bursitis, right hip: Secondary | ICD-10-CM

## 2020-05-23 DIAGNOSIS — M545 Low back pain, unspecified: Secondary | ICD-10-CM

## 2020-05-23 DIAGNOSIS — G8929 Other chronic pain: Secondary | ICD-10-CM

## 2020-05-28 DIAGNOSIS — J301 Allergic rhinitis due to pollen: Secondary | ICD-10-CM | POA: Diagnosis not present

## 2020-05-31 ENCOUNTER — Other Ambulatory Visit: Payer: Self-pay | Admitting: Physician Assistant

## 2020-05-31 DIAGNOSIS — H1013 Acute atopic conjunctivitis, bilateral: Secondary | ICD-10-CM

## 2020-05-31 DIAGNOSIS — J302 Other seasonal allergic rhinitis: Secondary | ICD-10-CM

## 2020-06-04 DIAGNOSIS — J301 Allergic rhinitis due to pollen: Secondary | ICD-10-CM | POA: Diagnosis not present

## 2020-06-11 DIAGNOSIS — J301 Allergic rhinitis due to pollen: Secondary | ICD-10-CM | POA: Diagnosis not present

## 2020-06-19 DIAGNOSIS — J301 Allergic rhinitis due to pollen: Secondary | ICD-10-CM | POA: Diagnosis not present

## 2020-06-27 ENCOUNTER — Other Ambulatory Visit: Payer: Self-pay | Admitting: Physician Assistant

## 2020-06-27 DIAGNOSIS — G43009 Migraine without aura, not intractable, without status migrainosus: Secondary | ICD-10-CM

## 2020-07-09 DIAGNOSIS — J301 Allergic rhinitis due to pollen: Secondary | ICD-10-CM | POA: Diagnosis not present

## 2020-07-17 DIAGNOSIS — J301 Allergic rhinitis due to pollen: Secondary | ICD-10-CM | POA: Diagnosis not present

## 2020-07-30 DIAGNOSIS — J301 Allergic rhinitis due to pollen: Secondary | ICD-10-CM | POA: Diagnosis not present

## 2020-08-07 DIAGNOSIS — J301 Allergic rhinitis due to pollen: Secondary | ICD-10-CM | POA: Diagnosis not present

## 2020-08-13 DIAGNOSIS — J301 Allergic rhinitis due to pollen: Secondary | ICD-10-CM | POA: Diagnosis not present

## 2020-08-20 DIAGNOSIS — J301 Allergic rhinitis due to pollen: Secondary | ICD-10-CM | POA: Diagnosis not present

## 2020-08-25 ENCOUNTER — Other Ambulatory Visit: Payer: Self-pay | Admitting: Physician Assistant

## 2020-08-25 DIAGNOSIS — G8929 Other chronic pain: Secondary | ICD-10-CM

## 2020-08-25 DIAGNOSIS — M7061 Trochanteric bursitis, right hip: Secondary | ICD-10-CM

## 2020-08-28 ENCOUNTER — Other Ambulatory Visit: Payer: Self-pay | Admitting: Physician Assistant

## 2020-08-28 DIAGNOSIS — J301 Allergic rhinitis due to pollen: Secondary | ICD-10-CM | POA: Diagnosis not present

## 2020-08-28 DIAGNOSIS — G43009 Migraine without aura, not intractable, without status migrainosus: Secondary | ICD-10-CM

## 2020-08-29 ENCOUNTER — Ambulatory Visit: Payer: BC Managed Care – PPO | Admitting: Physician Assistant

## 2020-08-29 ENCOUNTER — Encounter: Payer: Self-pay | Admitting: Physician Assistant

## 2020-08-29 VITALS — BP 148/91 | HR 68 | Ht 62.25 in | Wt 167.0 lb

## 2020-08-29 DIAGNOSIS — N951 Menopausal and female climacteric states: Secondary | ICD-10-CM | POA: Diagnosis not present

## 2020-08-29 DIAGNOSIS — M7061 Trochanteric bursitis, right hip: Secondary | ICD-10-CM

## 2020-08-29 DIAGNOSIS — M545 Low back pain: Secondary | ICD-10-CM | POA: Diagnosis not present

## 2020-08-29 DIAGNOSIS — M5136 Other intervertebral disc degeneration, lumbar region: Secondary | ICD-10-CM

## 2020-08-29 DIAGNOSIS — Z79899 Other long term (current) drug therapy: Secondary | ICD-10-CM

## 2020-08-29 DIAGNOSIS — G8929 Other chronic pain: Secondary | ICD-10-CM | POA: Diagnosis not present

## 2020-08-29 DIAGNOSIS — M160 Bilateral primary osteoarthritis of hip: Secondary | ICD-10-CM

## 2020-08-29 MED ORDER — MELOXICAM 15 MG PO TABS: 15.0000 mg | ORAL_TABLET | Freq: Every day | ORAL | 5 refills | Status: DC

## 2020-08-29 NOTE — Progress Notes (Signed)
Subjective:    Patient ID: Jodi Perry, female    DOB: 1961-02-09, 59 y.o.   MRN: [id number]  HPI  Patient is a 59 year old obese female who presents to the clinic to follow-up on lumbar degenerative disc disease, bilateral primary osteoarthritis, chronic low back pain and hot flashes.  At last visit she was given an injection for greater trochanter bursitis.  Shot helped significantly for about a month and then has slowly waned.  She is concerned about the cost of physical therapy and time.  She is aware that her work exacerbates her pain significantly with the bending, lifting, twisting that she has to do for a good portion of the day.  She is using TENS unit and icing which helps some.  She denies any radiation of pain down into her leg, saddle anesthesia, bowel bladder dysfunction, leg weakness.  Symptoms seem to be worse after sitting and better after standing and walking.  Patient did try Effexor for her hot flashes.  There is no significant improvement and seemed to just make her feel funny.  She discontinued.   .. Active Ambulatory Problems    Diagnosis Date Noted  . Migraine without aura and without status migrainosus, not intractable 10/25/2014  . Class 1 obesity due to excess calories without serious comorbidity with body mass index (BMI) of 31.0 to 31.9 in adult 10/29/2014  . Anxiety 10/29/2014  . BMI 30.0-30.9,adult 03/03/2015  . Seborrheic keratoses 08/01/2015  . 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 Allergic conjunctivitis of both eyes 12/02/2017  . Seasonal allergies 08/29/2018  . Elevated blood pressure reading 08/29/2018  . Acute right hip pain 01/14/2020  . Chronic bilateral low back pain without sciatica 01/14/2020  . Elevated LDL cholesterol level 01/22/2020  . Greater trochanteric bursitis of right hip 08/29/2020  . Hot flashes, menopausal 09/03/2020  . Lumbar degenerative disc disease 09/03/2020  . Primary osteoarthritis of both hips 09/03/2020   Resolved  Ambulatory Problems    Diagnosis Date Noted  . MIGRAINE HEADACHE 09/08/2010  . Depression 10/29/2014  . Abnormal weight gain 10/29/2014  . Tetanus toxoid vaccination within the past 10 years 03/05/2015  . BPPV (benign paroxysmal positional vertigo) 07/15/2015  . Thumb pain 12/30/2015   No Additional Past Medical History       Review of Systems  All other systems reviewed and are negative.      Objective:   Physical Exam Vitals reviewed.  Constitutional:      Appearance: Normal appearance. She is obese.  Cardiovascular:     Rate and Rhythm: Normal rate.  Pulmonary:     Effort: Pulmonary effort is normal.     Breath sounds: Normal breath sounds.  Musculoskeletal:     Right lower leg: No edema.     Left lower leg: No edema.     Comments: Pinpoint tenderness over greater trochanter bursa, right.  Pain with external range of motion.  No significant pain with internal range of motion. Negative straight leg raise. No significant tenderness over lumbar spine Gluteal tightness on the right side.  Neurological:     General: No focal deficit present.     Mental Status: She is alert and oriented to person, place, and time.  Psychiatric:        Mood and Affect: Mood normal.          .. Depression screen [facility] 2/9 02/22/2020 03/22/2017  Decreased Interest 0 0  Down, Depressed, Hopeless 0 0  PHQ - 2 Score 0 0  Altered  sleeping 2 -  Tired, decreased energy 2 -  Change in appetite 0 -  Feeling bad or failure about yourself  0 -  Trouble concentrating 1 -  Moving slowly or fidgety/restless 0 -  Suicidal thoughts 0 -  PHQ-9 Score 5 -   .Marland Kitchen GAD 7 : Generalized Anxiety Score 02/22/2020  Nervous, Anxious, on Edge 0  Control/stop worrying 0  Worry too much - different things 0  Trouble relaxing 0  Restless 0  Easily annoyed or irritable 1  Afraid - awful might happen 0  Total GAD 7 Score 1  Anxiety Difficulty Not difficult at all     Assessment & Plan:  Marland KitchenMarland KitchenKameshia was  seen today for follow-up.  Diagnoses and all orders for this visit:  Chronic bilateral low back pain without sciatica -     meloxicam (MOBIC) 15 MG tablet; Take 1 tablet (15 mg total) by mouth daily. -     Ambulatory referral to Physical Therapy  Hot flashes, menopausal  Greater trochanteric bursitis of right hip -     meloxicam (MOBIC) 15 MG tablet; Take 1 tablet (15 mg total) by mouth daily. -     Ambulatory referral to Physical Therapy  Medication management -     Basic metabolic panel  Lumbar degenerative disc disease -     Ambulatory referral to Physical Therapy  Primary osteoarthritis of both hips -     Ambulatory referral to Physical Therapy   I do think patient would benefit significantly from physical therapy.  At least want her to price this since she has not with this new insurance.  Will place referral. I did go ahead and write some restrictions at work so that she does not have to do any prolonged bending, pulling, pushing at work for longer than 30 minutes.  Strongly encouraged her to wear a lumbar support brace when she is at work. Continue Mobic daily. It has been 6 months so we can do a another bursal injection since that did give her some relief. I certainly do not think bursitis is all patient has.  I think her other degenerative conditions are causing her bursa inflammation.  She could even benefit from a hip injection.  I would like for her to follow-up in the next month with Dr. Benjamin Stain our sports medicine provider.  effexor did not work. Discussed [facility] for herbal treatments or to consider [place]. Could consider HRT but comes with risk. Other non hormonal treatments as well like gabapentin we could try. Pt will try natural for now.   BursaInjection Procedure Note  Pre-operative Diagnosis: right Trochanteric bursitis  Post-operative Diagnosis: same  Indications: bursitis  Anesthesia: ethyl chloride without added sodium  bicarbonate  Procedure Details   After a discussion of the risks and benefits with the patient (including the possibility that any manipulation of the bursa could introduce infection, worsening the current situation significantly), verbal consent was obtained for the procedure. The joint was prepped with alcohol swab. An 18 gauge needle was introduced into right bursa and injected depo medrol 40mg  1cc and 9cc of 1 percent lidocaine. The injection site was cleansed with topical isopropyl alcohol and a dressing was applied.  Complications:  None; patient tolerated the procedure well.

## 2020-08-29 NOTE — Patient Instructions (Addendum)
Consider a brace at work.  No prolonged bending at the waist or pulling/pushing. Note written for work.  Will refer to PT.  Continue on mobic.  Follow up with Dr. Marijean Niemann herb shot for hot flashes.

## 2020-09-03 DIAGNOSIS — N951 Menopausal and female climacteric states: Secondary | ICD-10-CM | POA: Insufficient documentation

## 2020-09-03 DIAGNOSIS — M160 Bilateral primary osteoarthritis of hip: Secondary | ICD-10-CM | POA: Insufficient documentation

## 2020-09-03 DIAGNOSIS — J301 Allergic rhinitis due to pollen: Secondary | ICD-10-CM | POA: Diagnosis not present

## 2020-09-03 DIAGNOSIS — M5136 Other intervertebral disc degeneration, lumbar region: Secondary | ICD-10-CM | POA: Insufficient documentation

## 2020-09-05 ENCOUNTER — Ambulatory Visit (INDEPENDENT_AMBULATORY_CARE_PROVIDER_SITE_OTHER): Payer: BC Managed Care – PPO | Admitting: Rehabilitative and Restorative Service Providers"

## 2020-09-05 ENCOUNTER — Encounter: Payer: Self-pay | Admitting: Rehabilitative and Restorative Service Providers"

## 2020-09-05 ENCOUNTER — Other Ambulatory Visit: Payer: Self-pay | Admitting: Physician Assistant

## 2020-09-05 ENCOUNTER — Other Ambulatory Visit: Payer: Self-pay

## 2020-09-05 DIAGNOSIS — R29898 Other symptoms and signs involving the musculoskeletal system: Secondary | ICD-10-CM | POA: Diagnosis not present

## 2020-09-05 DIAGNOSIS — G8929 Other chronic pain: Secondary | ICD-10-CM

## 2020-09-05 DIAGNOSIS — R293 Abnormal posture: Secondary | ICD-10-CM | POA: Diagnosis not present

## 2020-09-05 DIAGNOSIS — M5441 Lumbago with sciatica, right side: Secondary | ICD-10-CM | POA: Diagnosis not present

## 2020-09-05 DIAGNOSIS — M25551 Pain in right hip: Secondary | ICD-10-CM

## 2020-09-05 DIAGNOSIS — M545 Low back pain, unspecified: Secondary | ICD-10-CM

## 2020-09-05 NOTE — Patient Instructions (Addendum)
  Access Code: [email or web address]: [email or web address]: 09/24/2021Prepared by: Kewanna Kasprzak HoltExercises  Supine Transversus Abdominis Bracing with Pelvic Floor Contraction - 2 x daily - 7 x weekly - 1 sets - 10 reps - 10sec hold  Prone Press Up - 2 x daily - 7 x weekly - 1 sets - 10 reps - 2-3 sec hold  Supine Piriformis Stretch with Leg Straight - 2 x daily - 7 x weekly - 1 sets - 3 reps - 30 sec hold Patient Education  Trigger Point Dry Needling

## 2020-09-05 NOTE — Therapy (Signed)
[facility] [place] [address] [place], [place], [address] Phone: [phone]   Fax:  [phone]  Physical Therapy Evaluation  Patient Details  Name: Jodi Perry MRN: [id number] Date of Birth: 17-May-1961 Referring Provider (PT): Tandy Gaw, [place]    Encounter Date: 09/05/2020   PT End of Session - 09/05/20 1136    Visit Number 1    Number of Visits 12    Date for PT Re-Evaluation 10/17/20    PT Start Time 0930    PT Stop Time 1018    PT Time Calculation (min) 48 min    Activity Tolerance Patient tolerated treatment well           Past Medical History:  Diagnosis Date  . BPPV (benign paroxysmal positional vertigo) 07/15/2015  . Depression 10/29/2014  . Migraine without aura and without status migrainosus, not intractable 10/25/2014    Past Surgical History:  Procedure Laterality Date  . TUBAL LIGATION      There were no vitals filed for this visit.    Subjective Assessment - 09/05/20 0939    Subjective Patient reports that she has had LBP and Rt hip pain for the past 1-2 years with no known injury. Symptoms have increased in the past several months. She has pain with functional activities. Injection helped during the day but has pain in the Rt LE at night when she lies down to sleep.    Pertinent History Injury to Lt foot requiring pt to wear a walking boot for 3 months with resultant pain; allergies; tubes tied    Patient Stated Goals Lesly Rubenstein thought it might be a good idea to see if there are some things to loosen the muscles up    Currently in Pain? No/denies    Pain Score 0-No pain    Pain Location Back   into the Rt hip   Pain Orientation Right;Lower    Pain Descriptors / Indicators Pounding;Sharp    Pain Type Chronic pain    Pain Radiating Towards into Rt posterior hip to thigh    Pain Onset More than a month ago    Pain Frequency Intermittent    Aggravating Factors  lying down to sleep; lying on either  side; lifting; bending; prolonged standing; walking at times    Pain Relieving Factors TENS; injection              [facility] PT Assessment - 09/05/20 0001      Assessment   Medical Diagnosis Chronic LBP; Rt hip and LE pain     Referring Provider (PT) Tandy Gaw, PA-C     Onset Date/Surgical Date 02/11/20    Hand Dominance Right    Next MD Visit 09/29/20 Dr T     Prior Therapy yes for vertigo       Precautions   Precautions None      Restrictions   Weight Bearing Restrictions No      Balance Screen   Has the patient fallen in the past 6 months No    Has the patient had a decrease in activity level because of a fear of falling?  No    Is the patient reluctant to leave their home because of a fear of falling?  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, up and  down prior     Leisure household chores inside and outside; love crafting and sewing; otherwise sedentary       Observation/Other Assessments   Focus on Therapeutic Outcomes (FOTO)  51% limitation       Sensation   Additional Comments WFL's per pt report       AROM   Lumbar Flexion 90%     Lumbar Extension 65%     Lumbar - Right Side Bend 60% pain in the Rt low back towards sacrum    Lumbar - Left Side Bend 80%     Lumbar - Right Rotation 55%     Lumbar - Left Rotation 55%      Strength   Overall Strength Comments WFL's for functional activities not tested resistively       Flexibility   Hamstrings WFL's slightly tighter Rt than Lt     Quadriceps WFL's bilat     ITB tight Rt     Piriformis tight Rt       Palpation   Spinal mobility hypomobile lumbar spine with CPA mobs     Palpation comment muscular tightness Rt psops; QL; gluts; piriformis       Special Tests   Other special tests (-) SLR; slump                       Objective measurements  completed on examination: See above findings.       [facility] Adult PT Treatment/Exercise - 09/05/20 0001      Self-Care   Self-Care --   initiated back care education      Lumbar Exercises: Stretches   Hip Flexor Stretch Right;Left;2 reps;30 seconds   seated   Press Ups 10 reps   2-3 sec hold    Piriformis Stretch Right;2 reps;30 seconds   supine travell                  PT Education - 09/05/20 1133    Education Details HEP POC DN    Person(s) Educated Patient    Methods Explanation;Demonstration;Tactile cues;Verbal cues;Handout    Comprehension Verbalized understanding;Returned demonstration;Verbal cues required;Tactile cues required               PT Long Term Goals - 09/05/20 1144      PT LONG TERM GOAL #1   Title Decrease pain in the Rt LB and LE by 50-75% allowing patient to participate in exercise, ADL's and work tasks with minimal pain or difficulty    Baseline -    Time 6    Period Weeks    Status New    Target Date 10/17/20      PT LONG TERM GOAL #2   Title Increase trunk and LE mobility with improved tissue extensibility through the tight tissues of Rt hip demonstrated by elongated stretch position for LB and hip musculature    Baseline -    Time 6    Period Weeks    Status New    Target Date 10/17/20      PT LONG TERM GOAL #3   Title Patient to report improved sleeping - with ability to sleep for 3-4 hours without awakening due to pain    Baseline -    Time 6    Period Weeks    Status New    Target Date 10/17/20      PT LONG TERM GOAL #4   Title Independent in HEP    Time 6  Period Weeks    Status New    Target Date 10/17/20      PT LONG TERM GOAL #5   Title Improve FOTO to </= 43% limitation    Time 6    Period Weeks    Status New    Target Date 10/17/20                  Plan - 09/05/20 1008    Clinical Impression Statement Patient presents with ~ 2 year history of Rt LB and LE pain with incresaed symptoms in the past 6  months. She was in a walking boot on the Lt LE for 3 months several years ago and noticed some LBP during that time. She does not know of any injury recently. Patient has poor posture and alignment; limited trunk and LE mobility/ROM. She has muscular tightness to palpation through the Rt psoas; QL; lats; gluts; piriformis muculature. Patient is very reactive to introduction of any exercise she has tried in the past. She has decreased tolerance with for functional activities around her home and by the end of her work day she has increased symptoms and difficulty sleeping. Patient will beneft from PT to address problems identified.    Stability/Clinical Decision Making Stable/Uncomplicated    Clinical Decision Making Low    Rehab Potential Good    PT Frequency 2x / week    PT Duration 6 weeks    PT Treatment/Interventions ADLs/Self Care Home Management;Aquatic Therapy;Cryotherapy;Electrical Stimulation;Iontophoresis 4mg /ml Dexamethasone;Moist Heat;Ultrasound;Functional mobility training;Therapeutic activities;Therapeutic exercise;Balance training;Neuromuscular re-education;Patient/family education;Manual techniques;Dry needling;Taping    PT Next Visit Plan assess response to initial HEP; check HEP; trial of DN to Rt posterior lumbar and hip; work on core stabilization; manual work and DN as indicated; modalities as indicated(has TENS unit for home)    PT Home Exercise Plan [email or web address]    Consulted and Agree with Plan of Care Patient           Patient will benefit from skilled therapeutic intervention in order to improve the following deficits and impairments:  Difficulty walking, Increased fascial restricitons, Decreased activity tolerance, Pain, Hypomobility, Impaired flexibility, Improper body mechanics, Decreased mobility, Decreased strength, Impaired sensation, Postural dysfunction  Visit Diagnosis: Chronic right-sided low back pain with right-sided sciatica - Plan: PT plan of care  cert/re-cert  Other symptoms and signs involving the musculoskeletal system - Plan: PT plan of care cert/re-cert  Abnormal posture - Plan: PT plan of care cert/re-cert     Problem List Patient Active Problem List   Diagnosis Date Noted  . Hot flashes, menopausal 09/03/2020  . Lumbar degenerative disc disease 09/03/2020  . Primary osteoarthritis of both hips 09/03/2020  . Greater trochanteric bursitis of right hip 08/29/2020  . Elevated LDL cholesterol level 01/22/2020  . Acute right hip pain 01/14/2020  . Chronic bilateral low back pain without sciatica 01/14/2020  . Seasonal allergies 08/29/2018  . Elevated blood pressure reading 08/29/2018  . Allergic conjunctivitis of both eyes 12/02/2017  . 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 Seborrheic keratoses 08/01/2015  . BMI 30.0-30.9,adult 03/03/2015  . Class 1 obesity due to excess calories without serious comorbidity with body mass index (BMI) of 31.0 to 31.9 in adult 10/29/2014  . Anxiety 10/29/2014  . Migraine without aura and without status migrainosus, not intractable 10/25/2014    Devon Kingdon 10/27/2014 PT, MPH  09/05/2020, 11:50 AM  [facility] [address] [place], [place], [place] Phone: [phone]   Fax:  [phone]  Name: Wilhemenia Camba MRN: [id number] Date of Birth: 03-Oct-1961

## 2020-09-10 ENCOUNTER — Other Ambulatory Visit: Payer: Self-pay | Admitting: Physician Assistant

## 2020-09-10 DIAGNOSIS — G8929 Other chronic pain: Secondary | ICD-10-CM

## 2020-09-10 DIAGNOSIS — M545 Low back pain, unspecified: Secondary | ICD-10-CM

## 2020-09-10 DIAGNOSIS — M25551 Pain in right hip: Secondary | ICD-10-CM

## 2020-09-10 DIAGNOSIS — J301 Allergic rhinitis due to pollen: Secondary | ICD-10-CM | POA: Diagnosis not present

## 2020-09-12 ENCOUNTER — Other Ambulatory Visit: Payer: Self-pay

## 2020-09-12 ENCOUNTER — Ambulatory Visit (INDEPENDENT_AMBULATORY_CARE_PROVIDER_SITE_OTHER): Payer: BC Managed Care – PPO | Admitting: Rehabilitative and Restorative Service Providers"

## 2020-09-12 ENCOUNTER — Encounter: Payer: Self-pay | Admitting: Rehabilitative and Restorative Service Providers"

## 2020-09-12 DIAGNOSIS — R29898 Other symptoms and signs involving the musculoskeletal system: Secondary | ICD-10-CM

## 2020-09-12 DIAGNOSIS — R293 Abnormal posture: Secondary | ICD-10-CM | POA: Diagnosis not present

## 2020-09-12 DIAGNOSIS — M5441 Lumbago with sciatica, right side: Secondary | ICD-10-CM | POA: Diagnosis not present

## 2020-09-12 DIAGNOSIS — G8929 Other chronic pain: Secondary | ICD-10-CM | POA: Diagnosis not present

## 2020-09-12 NOTE — Therapy (Signed)
[facility] [place] [address] [place], [place], [address] Phone: [phone]   Fax:  [phone]  Physical Therapy Treatment  Patient Details  Name: Jodi Perry MRN: [id number] Date of Birth: 1961-02-25 Referring Provider (PT): Tandy Gaw, [place]    Encounter Date: 09/12/2020   PT End of Session - 09/12/20 1404    Visit Number 2    Number of Visits 12    Date for PT Re-Evaluation 10/17/20    PT Start Time 1400    PT Stop Time 1441    PT Time Calculation (min) 41 min    Activity Tolerance Patient tolerated treatment well           Past Medical History:  Diagnosis Date  . BPPV (benign paroxysmal positional vertigo) 07/15/2015  . Depression 10/29/2014  . Migraine without aura and without status migrainosus, not intractable 10/25/2014    Past Surgical History:  Procedure Laterality Date  . TUBAL LIGATION      There were no vitals filed for this visit.   Subjective Assessment - 09/12/20 1404    Subjective Patient reports that she is feeling much better. Having less pain in the Rt hip and LB. Having difficulty with climbing steps but pain is much better. Energy level is increased and she is sleepiing better.    Currently in Pain? No/denies    Pain Score 0-No pain    Pain Location Back                             [facility] Adult PT Treatment/Exercise - 09/12/20 0001      Therapeutic Activites    Therapeutic Activities --   myofacial ball release work posterior hip in standing     Lumbar Exercises: Stretches   Hip Flexor Stretch Right;Left;2 reps;30 seconds   seated   Press Ups 10 reps   2-3 sec hold    Piriformis Stretch Right;2 reps;30 seconds   supine travell      Lumbar Exercises: Aerobic   Nustep L4 x 6.5 min UE 9       Lumbar Exercises: Standing   Wall Slides 5 reps;5 seconds    Other Standing Lumbar Exercises step up 6 inch step HH assist 5 reps each side       Lumbar Exercises:  Seated   Sit to Stand 10 reps    Sit to Stand Limitations working on hinged hip and core tight from treatment table       Lumbar Exercises: Supine   Other Supine Lumbar Exercises --                  PT Education - 09/12/20 1427    Education Details HEP myofacial ball release work    Teacher, music) Educated Patient    Methods Explanation;Demonstration;Tactile cues;Verbal cues;Handout    Comprehension Verbalized understanding;Returned demonstration;Verbal cues required;Tactile cues required               PT Long Term Goals - 09/05/20 1144      PT LONG TERM GOAL #1   Title Decrease pain in the Rt LB and LE by 50-75% allowing patient to participate in exercise, ADL's and work tasks with minimal pain or difficulty    Baseline -    Time 6    Period Weeks    Status New    Target Date 10/17/20      PT LONG TERM GOAL #2   Title  Increase trunk and LE mobility with improved tissue extensibility through the tight tissues of Rt hip demonstrated by elongated stretch position for LB and hip musculature    Baseline -    Time 6    Period Weeks    Status New    Target Date 10/17/20      PT LONG TERM GOAL #3   Title Patient to report improved sleeping - with ability to sleep for 3-4 hours without awakening due to pain    Baseline -    Time 6    Period Weeks    Status New    Target Date 10/17/20      PT LONG TERM GOAL #4   Title Independent in HEP    Time 6    Period Weeks    Status New    Target Date 10/17/20      PT LONG TERM GOAL #5   Title Improve FOTO to </= 43% limitation    Time 6    Period Weeks    Status New    Target Date 10/17/20                 Plan - 09/12/20 1407    Clinical Impression Statement Patient reports positive response to initial exercise and treatment. She has been consistent with her HEP. Wants to wait on DN to see how she does this week with exercise program. Added functional strengthening with VC to engage core    Rehab Potential  Good    PT Frequency 2x / week    PT Duration 6 weeks    PT Treatment/Interventions ADLs/Self Care Home Management;Aquatic Therapy;Cryotherapy;Electrical Stimulation;Iontophoresis 4mg /ml Dexamethasone;Moist Heat;Ultrasound;Functional mobility training;Therapeutic activities;Therapeutic exercise;Balance training;Neuromuscular re-education;Patient/family education;Manual techniques;Dry needling;Taping    PT Next Visit Plan assess response HEP; check HEP; trial of DN to Rt posterior lumbar and hip if needed; work on core stabilization; manual work and DN as indicated; modalities as indicated(has TENS unit for home) - progress exercise adding plank at wall and functional strengthening    PT Home Exercise Plan [email or web address]    Consulted and Agree with Plan of Care Patient           Patient will benefit from skilled therapeutic intervention in order to improve the following deficits and impairments:     Visit Diagnosis: Chronic right-sided low back pain with right-sided sciatica  Other symptoms and signs involving the musculoskeletal system  Abnormal posture     Problem List Patient Active Problem List   Diagnosis Date Noted  . Hot flashes, menopausal 09/03/2020  . Lumbar degenerative disc disease 09/03/2020  . Primary osteoarthritis of both hips 09/03/2020  . Greater trochanteric bursitis of right hip 08/29/2020  . Elevated LDL cholesterol level 01/22/2020  . Acute right hip pain 01/14/2020  . Chronic bilateral low back pain without sciatica 01/14/2020  . Seasonal allergies 08/29/2018  . Elevated blood pressure reading 08/29/2018  . Allergic conjunctivitis of both eyes 12/02/2017  . 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 Seborrheic keratoses 08/01/2015  . BMI 30.0-30.9,adult 03/03/2015  . Class 1 obesity due to excess calories without serious comorbidity with body mass index (BMI) of 31.0 to 31.9 in adult 10/29/2014  . Anxiety 10/29/2014  . Migraine without aura and without status  migrainosus, not intractable 10/25/2014    Mirka Barbone 10/27/2014 PT, MPH  09/12/2020, 2:39 PM  [facility] [address] [place], [place], [place] Phone: [phone]   Fax:  [phone]  Name: Harmoney Sienkiewicz  MRN: [id number] Date of Birth: 1961-11-09

## 2020-09-12 NOTE — Patient Instructions (Signed)
Access Code: [email or web address]: [email or web address]: 10/01/2021Prepared by: Eldin Bonsell HoltExercises  Supine Transversus Abdominis Bracing with Pelvic Floor Contraction - 2 x daily - 7 x weekly - 1 sets - 10 reps - 10sec hold  Prone Press Up - 2 x daily - 7 x weekly - 1 sets - 10 reps - 2-3 sec hold  Supine Piriformis Stretch with Leg Straight - 2 x daily - 7 x weekly - 1 sets - 3 reps - 30 sec hold  Step Up - 2 x daily - 7 x weekly - 1 sets - 10 reps - 2 sec hold  Sit to Stand - 2 x daily - 7 x weekly - 1 sets - 10 reps - 3-5 sec hold  Wall Quarter Squat - 2 x daily - 7 x weekly - 1-2 sets - 10 reps - 5-10 sec hold

## 2020-09-15 ENCOUNTER — Other Ambulatory Visit: Payer: Self-pay

## 2020-09-15 ENCOUNTER — Encounter: Payer: Self-pay | Admitting: Rehabilitative and Restorative Service Providers"

## 2020-09-15 ENCOUNTER — Ambulatory Visit (INDEPENDENT_AMBULATORY_CARE_PROVIDER_SITE_OTHER): Payer: BC Managed Care – PPO | Admitting: Rehabilitative and Restorative Service Providers"

## 2020-09-15 DIAGNOSIS — M5441 Lumbago with sciatica, right side: Secondary | ICD-10-CM | POA: Diagnosis not present

## 2020-09-15 DIAGNOSIS — R293 Abnormal posture: Secondary | ICD-10-CM

## 2020-09-15 DIAGNOSIS — G8929 Other chronic pain: Secondary | ICD-10-CM

## 2020-09-15 DIAGNOSIS — R29898 Other symptoms and signs involving the musculoskeletal system: Secondary | ICD-10-CM | POA: Diagnosis not present

## 2020-09-15 NOTE — Therapy (Signed)
[facility] [place] [address] [place], [place], [address] Phone: [phone]   Fax:  [phone]  Physical Therapy Treatment  Patient Details  Name: Jodi Perry MRN: [id number] Date of Birth: January 18, 1961 Referring Provider (PT): Tandy Gaw, [place]    Encounter Date: 09/15/2020   PT End of Session - 09/15/20 1435    Visit Number 3    Number of Visits 12    Date for PT Re-Evaluation 10/17/20    PT Start Time 1430    PT Stop Time 1518    PT Time Calculation (min) 48 min    Activity Tolerance Patient tolerated treatment well           Past Medical History:  Diagnosis Date  . BPPV (benign paroxysmal positional vertigo) 07/15/2015  . Depression 10/29/2014  . Migraine without aura and without status migrainosus, not intractable 10/25/2014    Past Surgical History:  Procedure Laterality Date  . TUBAL LIGATION      There were no vitals filed for this visit.   Subjective Assessment - 09/15/20 1435    Subjective Patient reports that she was doing OK until Saturday when she was cutting fabric. She has increased "tightness" in the back. No pain but having some stiffness.    Currently in Pain? No/denies    Pain Score 0-No pain    Pain Location Back    Pain Orientation Right;Lower    Pain Descriptors / Indicators Tightness              [facility] PT Assessment - 09/15/20 0001      Assessment   Medical Diagnosis Chronic LBP; Rt hip and LE pain     Referring Provider (PT) Tandy Gaw, PA-C     Onset Date/Surgical Date 02/11/20    Hand Dominance Right    Next MD Visit 09/29/20 Dr T     Prior Therapy yes for vertigo       AROM   Lumbar Flexion 90%     Lumbar Extension 70%    Lumbar - Right Side Bend 65% discomfort in the Rt LB    Lumbar - Left Side Bend 80%     Lumbar - Right Rotation 55%     Lumbar - Left Rotation 55%      Palpation   Spinal mobility hypomobile lumbar spine with CPA mobs     Palpation comment  muscular tightness Rt psoas; QL; gluts; piriformis                          [facility] Adult PT Treatment/Exercise - 09/15/20 0001      Lumbar Exercises: Stretches   Hip Flexor Stretch Right;Left;2 reps;30 seconds   seated   Press Ups 10 reps   2-3 sec hold    Piriformis Stretch Right;2 reps;30 seconds   supine travell      Lumbar Exercises: Aerobic   Nustep L5 x 5 min UE 9       Lumbar Exercises: Standing   Wall Slides 5 reps;5 seconds    Other Standing Lumbar Exercises step up 6 inch step HH assist 5 reps each side     Other Standing Lumbar Exercises plank at wall 60 sec x 2 reps       Lumbar Exercises: Seated   Sit to Stand 10 reps    Sit to Stand Limitations working on hinged hip and core tight from treatment table       Moist  Heat Therapy   Number Minutes Moist Heat 10 Minutes    Moist Heat Location Lumbar Spine      Manual Therapy   Manual therapy comments skilled palpation to assess tissue response to DN and manual work     Soft tissue mobilization deep tissue work through the Rt posterior hip/buttock     Myofascial Release posterior Rt hip                   PT Education - 09/15/20 1453    Education Details HEP back care    Person(s) Educated Patient    Methods Explanation;Demonstration;Verbal cues;Tactile cues;Handout    Comprehension Verbalized understanding;Returned demonstration;Verbal cues required;Tactile cues required               PT Long Term Goals - 09/05/20 1144      PT LONG TERM GOAL #1   Title Decrease pain in the Rt LB and LE by 50-75% allowing patient to participate in exercise, ADL's and work tasks with minimal pain or difficulty    Baseline -    Time 6    Period Weeks    Status New    Target Date 10/17/20      PT LONG TERM GOAL #2   Title Increase trunk and LE mobility with improved tissue extensibility through the tight tissues of Rt hip demonstrated by elongated stretch position for LB and hip musculature     Baseline -    Time 6    Period Weeks    Status New    Target Date 10/17/20      PT LONG TERM GOAL #3   Title Patient to report improved sleeping - with ability to sleep for 3-4 hours without awakening due to pain    Baseline -    Time 6    Period Weeks    Status New    Target Date 10/17/20      PT LONG TERM GOAL #4   Title Independent in HEP    Time 6    Period Weeks    Status New    Target Date 10/17/20      PT LONG TERM GOAL #5   Title Improve FOTO to </= 43% limitation    Time 6    Period Weeks    Status New    Target Date 10/17/20                 Plan - 09/15/20 1454    Clinical Impression Statement Some increased stiffness in the back in the past couple of days. Continued tightness in the Rt posterior hip/piriformis musculature. Trial of DN to Rt piriformis/gluts with good release of muscular tightness. Added plank on the wall to work on core strength. Continued with previous exercises.    Rehab Potential Good    PT Frequency 2x / week    PT Duration 6 weeks    PT Treatment/Interventions ADLs/Self Care Home Management;Aquatic Therapy;Cryotherapy;Electrical Stimulation;Iontophoresis 4mg /ml Dexamethasone;Moist Heat;Ultrasound;Functional mobility training;Therapeutic activities;Therapeutic exercise;Balance training;Neuromuscular re-education;Patient/family education;Manual techniques;Dry needling;Taping    PT Next Visit Plan assess response DN/manual work; check HEP; trial of DN to Rt posterior lumbar and hip if needed; work on core stabilization; manual work and DN as indicated; modalities as indicated(has TENS unit for home) - progress exercise adding plank variations as tolerated at wall and functional strengthening    PT Home Exercise Plan [email or web address]    Consulted and Agree with Plan of Care Patient  Patient will benefit from skilled therapeutic intervention in order to improve the following deficits and impairments:     Visit Diagnosis: Chronic  right-sided low back pain with right-sided sciatica  Other symptoms and signs involving the musculoskeletal system  Abnormal posture     Problem List Patient Active Problem List   Diagnosis Date Noted  . Hot flashes, menopausal 09/03/2020  . Lumbar degenerative disc disease 09/03/2020  . Primary osteoarthritis of both hips 09/03/2020  . Greater trochanteric bursitis of right hip 08/29/2020  . Elevated LDL cholesterol level 01/22/2020  . Acute right hip pain 01/14/2020  . Chronic bilateral low back pain without sciatica 01/14/2020  . Seasonal allergies 08/29/2018  . Elevated blood pressure reading 08/29/2018  . Allergic conjunctivitis of both eyes 12/02/2017  . 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 Seborrheic keratoses 08/01/2015  . BMI 30.0-30.9,adult 03/03/2015  . Class 1 obesity due to excess calories without serious comorbidity with body mass index (BMI) of 31.0 to 31.9 in adult 10/29/2014  . Anxiety 10/29/2014  . Migraine without aura and without status migrainosus, not intractable 10/25/2014    Thomasa Heidler Rober Minion PT, MPH  09/15/2020, 3:09 PM  [facility] [address] [place], [place], [address] Phone: [phone]   Fax:  [phone]  Name: Jodi Perry MRN: [id number] Date of Birth: 02-11-1961

## 2020-09-15 NOTE — Patient Instructions (Addendum)
Trigger Point Dry Needling  . What is Trigger Point Dry Needling (DN)? o DN is a physical therapy technique used to treat muscle pain and dysfunction. Specifically, DN helps deactivate muscle trigger points (muscle knots).  o A thin filiform needle is used to penetrate the skin and stimulate the underlying trigger point. The goal is for a local twitch response (LTR) to occur and for the trigger point to relax. No medication of any kind is injected during the procedure.   . What Does Trigger Point Dry Needling Feel Like?  o The procedure feels different for each individual patient. Some patients report that they do not actually feel the needle enter the skin and overall the process is not painful. Very mild bleeding may occur. However, many patients feel a deep cramping in the muscle in which the needle was inserted. This is the local twitch response.   Marland Kitchen How Will I feel after the treatment? o Soreness is normal, and the onset of soreness may not occur for a few hours. Typically this soreness does not last longer than two days.  o Bruising is uncommon, however; ice can be used to decrease any possible bruising.  o In rare cases feeling tired or nauseous after the treatment is normal. In addition, your symptoms may get worse before they get better, this period will typically not last longer than 24 hours.   . What Can I do After My Treatment? o Increase your hydration by drinking more water for the next 24 hours. o You may place ice or heat on the areas treated that have become sore, however, do not use heat on inflamed or bruised areas. Heat often brings more relief post needling. o You can continue your regular activities, but vigorous activity is not recommended initially after the treatment for 24 hours. o DN is best combined with other physical therapy such as strengthening, stretching, and other therapies.   Access Code: [email or web address]: [email or web address]: 10/04/2021Prepared  by: Tish Begin HoltExercises  Supine Transversus Abdominis Bracing with Pelvic Floor Contraction - 2 x daily - 7 x weekly - 1 sets - 10 reps - 10sec hold  Prone Press Up - 2 x daily - 7 x weekly - 1 sets - 10 reps - 2-3 sec hold  Supine Piriformis Stretch with Leg Straight - 2 x daily - 7 x weekly - 1 sets - 3 reps - 30 sec hold  Step Up - 2 x daily - 7 x weekly - 1 sets - 10 reps - 2 sec hold  Sit to Stand - 2 x daily - 7 x weekly - 1 sets - 10 reps - 3-5 sec hold  Wall Quarter Squat - 2 x daily - 7 x weekly - 1-2 sets - 10 reps - 5-10 sec hold  Standing Plank on Wall - 2 x daily - 7 x weekly - 1 sets - 3-5 reps - 30-60 sec hold

## 2020-09-17 DIAGNOSIS — J301 Allergic rhinitis due to pollen: Secondary | ICD-10-CM | POA: Diagnosis not present

## 2020-09-18 ENCOUNTER — Other Ambulatory Visit: Payer: Self-pay

## 2020-09-18 ENCOUNTER — Ambulatory Visit (INDEPENDENT_AMBULATORY_CARE_PROVIDER_SITE_OTHER): Payer: BC Managed Care – PPO | Admitting: Physical Therapy

## 2020-09-18 DIAGNOSIS — R29898 Other symptoms and signs involving the musculoskeletal system: Secondary | ICD-10-CM | POA: Diagnosis not present

## 2020-09-18 DIAGNOSIS — G8929 Other chronic pain: Secondary | ICD-10-CM

## 2020-09-18 DIAGNOSIS — M5441 Lumbago with sciatica, right side: Secondary | ICD-10-CM | POA: Diagnosis not present

## 2020-09-18 DIAGNOSIS — R293 Abnormal posture: Secondary | ICD-10-CM | POA: Diagnosis not present

## 2020-09-18 NOTE — Therapy (Signed)
[place], [place], [address] Phone: [phone]   Fax:  [phone]  Physical Therapy Treatment  Patient Details  Name: Jodi Perry MRN: [id number] Date of Birth: [birth year] Referring Provider (PT): Iran Planas, [place]    Encounter Date: 09/18/2020   PT End of Session - 09/18/20 1452    Visit Number 4    Number of Visits 12    Date for PT Re-Evaluation 10/17/20    PT Start Time 8768    PT Stop Time 1531    PT Time Calculation (min) 42 min    Activity Tolerance Patient tolerated treatment well    Behavior During Therapy [facility] for tasks assessed/performed           Past Medical History:  Diagnosis Date  . BPPV (benign paroxysmal positional vertigo) 07/15/2015  . Depression 10/29/2014  . Migraine without aura and without status migrainosus, not intractable 10/25/2014    Past Surgical History:  Procedure Laterality Date  . TUBAL LIGATION      There were no vitals filed for this visit.   Subjective Assessment - 09/18/20 1453    Subjective Pt reports she was able to go up steps a little easier than last week.  "I can sleep on my side now, without having constant pain".   She has a plan for cutting fabric (higher surface).    Currently in Pain? No/denies    Pain Score 0-No pain   just stiffness             [facility] PT Assessment - 09/18/20 0001      Assessment   Medical Diagnosis Chronic LBP; Rt hip and LE pain     Referring Provider (PT) Iran Planas, PA-C     Onset Date/Surgical Date 02/11/20    Hand Dominance Right    Next MD Visit 09/29/20 Dr T     Prior Therapy yes for vertigo            [facility] Adult PT Treatment/Exercise - 09/18/20 0001      Lumbar Exercises: Stretches   Piriformis Stretch Right;Left;1 rep;60 seconds    Piriformis Stretch Limitations 2nd rep on RLE in supine holding LE in ER (pt wanted feedback on form)      Lumbar Exercises: Aerobic   Tread Mill 1.0  mph x 4 min for warm up.       Lumbar Exercises: Standing   Other Standing Lumbar Exercises plank at counter x 20 sec x 2 reps       Lumbar Exercises: Seated   Sit to Stand 10 reps   cues to controlled descent    Sit to Stand Limitations did well with arms forward and cues to engage glutes      Lumbar Exercises: Supine   Clam 5 reps;4 seconds   unilateral clam with TA engaged.    Clam Limitations repeated with red band at thigh    Bent Knee Raise 10 reps   with TA engaged.    Bridge 10 reps;5 seconds      Knee/Hip Exercises: Standing   Forward Step Up Right;1 set;10 reps;Both;Step Height: 6";Hand Hold: 2   cues to not lean into Rt hip; improved tolerance.      Manual Therapy   Soft tissue mobilization TPR to Rt glute med, piriformis, and glute max with active IR/ER of hip with good release.  PT Long Term Goals - 09/18/20 1455      PT LONG TERM GOAL #1   Title Decrease pain in the Rt LB and LE by 50-75% allowing patient to participate in exercise, ADL's and work tasks with minimal pain or difficulty    Baseline 45% reduction - 09/18/20    Time 6    Period Weeks    Status On-going      PT LONG TERM GOAL #2   Title Increase trunk and LE mobility with improved tissue extensibility through the tight tissues of Rt hip demonstrated by elongated stretch position for LB and hip musculature    Baseline -    Time 6    Period Weeks    Status On-going      PT LONG TERM GOAL #3   Title Patient to report improved sleeping - with ability to sleep for 3-4 hours without awakening due to pain    Baseline -    Time 6    Period Weeks    Status Achieved      PT LONG TERM GOAL #4   Title Independent in HEP    Time 6    Period Weeks    Status On-going      PT LONG TERM GOAL #5   Title Improve FOTO to </= 43% limitation    Time 6    Period Weeks    Status On-going                 Plan - 09/18/20 1459    Clinical Impression Statement Pt  tolerated exercises well, without increase in Rt hip pain.  Some tenderness noted in Rt glute med, piriformis, and glute max insertion; improved with TPR.  Pt able to sleep without waking from pain; LTG#3 has been met.    Rehab Potential Good    PT Frequency 2x / week    PT Duration 6 weeks    PT Treatment/Interventions ADLs/Self Care Home Management;Aquatic Therapy;Cryotherapy;Electrical Stimulation;Iontophoresis 105m/ml Dexamethasone;Moist Heat;Ultrasound;Functional mobility training;Therapeutic activities;Therapeutic exercise;Balance training;Neuromuscular re-education;Patient/family education;Manual techniques;Dry needling;Taping    PT Next Visit Plan DN to Rt posterior lumbar and hip if needed; continue work on core stabilization; progress exercise adding plank variations as tolerated at wall and functional strengthening    PT Home Exercise Plan [email or web address]    Consulted and Agree with Plan of Care Patient           Patient will benefit from skilled therapeutic intervention in order to improve the following deficits and impairments:  Difficulty walking, Increased fascial restricitons, Decreased activity tolerance, Pain, Hypomobility, Impaired flexibility, Improper body mechanics, Decreased mobility, Decreased strength, Impaired sensation, Postural dysfunction  Visit Diagnosis: Chronic right-sided low back pain with right-sided sciatica  Other symptoms and signs involving the musculoskeletal system  Abnormal posture     Problem List Patient Active Problem List   Diagnosis Date Noted  . Hot flashes, menopausal 09/03/2020  . Lumbar degenerative disc disease 09/03/2020  . Primary osteoarthritis of both hips 09/03/2020  . Greater trochanteric bursitis of right hip 08/29/2020  . Elevated LDL cholesterol level 01/22/2020  . Acute right hip pain 01/14/2020  . Chronic bilateral low back pain without sciatica 01/14/2020  . Seasonal allergies 08/29/2018  . Elevated blood pressure reading  08/29/2018  . Allergic conjunctivitis of both eyes 12/02/2017  . 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 Seborrheic keratoses 08/01/2015  . BMI 30.0-30.9,adult 03/03/2015  . Class 1 obesity due to excess calories without serious comorbidity with body mass index (BMI)  of 31.0 to 31.9 in adult 10/29/2014  . Anxiety 10/29/2014  . Migraine without aura and without status migrainosus, not intractable 10/25/2014   Kerin Perna, PTA 09/18/20 4:01 PM  [place], [place], [address] Phone: [phone]   Fax:  [phone]  Name: Jodi Perry MRN: [id number] Date of Birth: 1961-02-05

## 2020-09-24 DIAGNOSIS — J301 Allergic rhinitis due to pollen: Secondary | ICD-10-CM | POA: Diagnosis not present

## 2020-09-25 ENCOUNTER — Encounter: Payer: Self-pay | Admitting: Rehabilitative and Restorative Service Providers"

## 2020-09-25 ENCOUNTER — Ambulatory Visit (INDEPENDENT_AMBULATORY_CARE_PROVIDER_SITE_OTHER): Payer: BC Managed Care – PPO | Admitting: Rehabilitative and Restorative Service Providers"

## 2020-09-25 ENCOUNTER — Other Ambulatory Visit: Payer: Self-pay

## 2020-09-25 DIAGNOSIS — G8929 Other chronic pain: Secondary | ICD-10-CM

## 2020-09-25 DIAGNOSIS — R293 Abnormal posture: Secondary | ICD-10-CM

## 2020-09-25 DIAGNOSIS — M5441 Lumbago with sciatica, right side: Secondary | ICD-10-CM | POA: Diagnosis not present

## 2020-09-25 DIAGNOSIS — R29898 Other symptoms and signs involving the musculoskeletal system: Secondary | ICD-10-CM | POA: Diagnosis not present

## 2020-09-25 NOTE — Patient Instructions (Addendum)
Access Code: [email or web address]: [email or web address]: 10/14/2021Prepared by: Jenayah Antu HoltExercises  Supine Transversus Abdominis Bracing with Pelvic Floor Contraction - 2 x daily - 7 x weekly - 1 sets - 10 reps - 10sec hold  Prone Press Up - 2 x daily - 7 x weekly - 1 sets - 10 reps - 2-3 sec hold  Supine Piriformis Stretch with Leg Straight - 2 x daily - 7 x weekly - 1 sets - 3 reps - 30 sec hold  Step Up - 2 x daily - 7 x weekly - 1 sets - 10 reps - 2 sec hold  Sit to Stand - 2 x daily - 7 x weekly - 1 sets - 10 reps - 3-5 sec hold  Wall Quarter Squat - 2 x daily - 7 x weekly - 1-2 sets - 10 reps - 5-10 sec hold  Standing Plank on Wall - 2 x daily - 7 x weekly - 1 sets - 3-5 reps - 30-60 sec hold  Plank on Counter - 2 x daily - 7 x weekly - 1 sets - 3 reps - 30-60 sec hold  Plank with Hip Extension on Counter - 2 x daily - 7 x weekly - 1 sets - 5-10 reps - 3-5 sec hold  Plank with Shoulder Flexion on Counter - 2 x daily - 7 x weekly - 1 sets - 5-10 reps - 3-5 sec hold   Trigger Point Dry Needling  . What is Trigger Point Dry Needling (DN)? o DN is a physical therapy technique used to treat muscle pain and dysfunction. Specifically, DN helps deactivate muscle trigger points (muscle knots).  o A thin filiform needle is used to penetrate the skin and stimulate the underlying trigger point. The goal is for a local twitch response (LTR) to occur and for the trigger point to relax. No medication of any kind is injected during the procedure.   . What Does Trigger Point Dry Needling Feel Like?  o The procedure feels different for each individual patient. Some patients report that they do not actually feel the needle enter the skin and overall the process is not painful. Very mild bleeding may occur. However, many patients feel a deep cramping in the muscle in which the needle was inserted. This is the local twitch response.   Marland Kitchen How Will I feel after the treatment? o Soreness is normal,  and the onset of soreness may not occur for a few hours. Typically this soreness does not last longer than two days.  o Bruising is uncommon, however; ice can be used to decrease any possible bruising.  o In rare cases feeling tired or nauseous after the treatment is normal. In addition, your symptoms may get worse before they get better, this period will typically not last longer than 24 hours.   . What Can I do After My Treatment? o Increase your hydration by drinking more water for the next 24 hours. o You may place ice or heat on the areas treated that have become sore, however, do not use heat on inflamed or bruised areas. Heat often brings more relief post needling. o You can continue your regular activities, but vigorous activity is not recommended initially after the treatment for 24 hours. o DN is best combined with other physical therapy such as strengthening, stretching, and other therapies.

## 2020-09-25 NOTE — Therapy (Signed)
[facility] [place] [address] [place], [place], [address] Phone: [phone]   Fax:  [phone]  Physical Therapy Treatment  Patient Details  Name: Jodi Perry MRN: [id number] Date of Birth: 10-28-1961 Referring Provider (PT): Tandy Gaw, [place]    Encounter Date: 09/25/2020   PT End of Session - 09/25/20 1514    Visit Number 5    Number of Visits 12    Date for PT Re-Evaluation 10/17/20    PT Start Time 1513    PT Stop Time 1602    PT Time Calculation (min) 49 min    Activity Tolerance Patient tolerated treatment well           Past Medical History:  Diagnosis Date  . BPPV (benign paroxysmal positional vertigo) 07/15/2015  . Depression 10/29/2014  . Migraine without aura and without status migrainosus, not intractable 10/25/2014    Past Surgical History:  Procedure Laterality Date  . TUBAL LIGATION      There were no vitals filed for this visit.   Subjective Assessment - 09/25/20 1517    Subjective Patient reports that she was able to go up the steps at work without pain. 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 She thinks the stretching helps and the DN helped and she has some sore spots that still need the needling. Has still used her "brace" most days but did not have it on today.    Currently in Pain? No/denies    Pain Score 0-No pain              [facility] PT Assessment - 09/25/20 0001      Assessment   Medical Diagnosis Chronic LBP; Rt hip and LE pain     Referring Provider (PT) Tandy Gaw, PA-C     Onset Date/Surgical Date 02/11/20    Hand Dominance Right    Next MD Visit 09/29/20 Dr T     Prior Therapy yes for vertigo       AROM   Overall AROM Comments no pain with lumbar or hip ROM     Right/Left Hip --   WFL's bilat    Lumbar Flexion 90%     Lumbar Extension 70%    Lumbar - Right Side Bend 80%    Lumbar - Left Side Bend 80%     Lumbar - Right Rotation 55%      Lumbar - Left Rotation 55%      Strength   Overall Strength Comments WFL's bilat LE's - weak core       Flexibility   Hamstrings WFL's slightly tighter Rt than Lt     Quadriceps WFL's bilat     ITB tight Rt     Piriformis tight Rt       Palpation   Spinal mobility hypomobile lumbar spine with CPA mobs     Palpation comment decreased muscular tightness Rt psoas; QL; gluts; piriformis                          [facility] Adult PT Treatment/Exercise - 09/25/20 0001      Lumbar Exercises: Stretches   Hip Flexor Stretch Right;Left;2 reps;30 seconds   sitting    Standing Side Bend Left;2 reps;10 seconds    Standing Extension 1 rep;5 seconds    Quad Stretch Right;Left;1 rep;20 seconds    Piriformis Stretch Right;Left;3 reps;30 seconds   supine travell  Lumbar Exercises: Aerobic   Recumbent Bike L2 x 5 min       Lumbar Exercises: Standing   Other Standing Lumbar Exercises plank at counter x 30 sec x 2 reps; counter plank with alternate shoulder flexion x 5 each UE; counter plank with alternate hip ext x 10 reps each LE       Lumbar Exercises: Seated   Sit to Stand 10 reps   cues to controlled descent      Lumbar Exercises: Supine   Clam 5 reps;3 seconds   alternate with red TB      Knee/Hip Exercises: Standing   Forward Step Up Right;1 set;10 reps;Both;Step Height: 6";Hand Hold: 2   cues to not lean into Rt hip; improved tolerance.      Moist Heat Therapy   Number Minutes Moist Heat 10 Minutes    Moist Heat Location Lumbar Spine      Manual Therapy   Manual therapy comments skilled palpation to assess tissue response to DN and manual work     Soft tissue mobilization deep tissue work through the Rt posterior hip/buttock through the piriformis; gluts    Myofascial Release posterior Rt hip             Trigger Point Dry Needling - 09/25/20 0001    Consent Given? Yes    Education Handout Provided Yes    Other Dry Needling Rt     Gluteus Maximus Response  Palpable increased muscle length    Piriformis Response Palpable increased muscle length                PT Education - 09/25/20 1549    Education Details HEP DN    Person(s) Educated Patient    Methods Explanation;Demonstration;Tactile cues;Verbal cues;Handout    Comprehension Verbalized understanding;Returned demonstration;Verbal cues required;Tactile cues required               PT Long Term Goals - 09/18/20 1455      PT LONG TERM GOAL #1   Title Decrease pain in the Rt LB and LE by 50-75% allowing patient to participate in exercise, ADL's and work tasks with minimal pain or difficulty    Baseline 45% reduction - 09/18/20    Time 6    Period Weeks    Status On-going      PT LONG TERM GOAL #2   Title Increase trunk and LE mobility with improved tissue extensibility through the tight tissues of Rt hip demonstrated by elongated stretch position for LB and hip musculature    Baseline -    Time 6    Period Weeks    Status On-going      PT LONG TERM GOAL #3   Title Patient to report improved sleeping - with ability to sleep for 3-4 hours without awakening due to pain    Baseline -    Time 6    Period Weeks    Status Achieved      PT LONG TERM GOAL #4   Title Independent in HEP    Time 6    Period Weeks    Status On-going      PT LONG TERM GOAL #5   Title Improve FOTO to </= 43% limitation    Time 6    Period Weeks    Status On-going                 Plan - 09/25/20 1515    Clinical Impression Statement Continued improvement with  resolution of pain and increased activity. Patient is walking up steps without pain. Functional activities have improved. Patient has responded well to DN, manual work and therapeutic exercises. Goals of therapy have been partially accomplished.    Rehab Potential Good    PT Frequency 2x / week    PT Duration 6 weeks    PT Treatment/Interventions ADLs/Self Care Home Management;Aquatic Therapy;Cryotherapy;Electrical  Stimulation;Iontophoresis 4mg /ml Dexamethasone;Moist Heat;Ultrasound;Functional mobility training;Therapeutic activities;Therapeutic exercise;Balance training;Neuromuscular re-education;Patient/family education;Manual techniques;Dry needling;Taping    PT Next Visit Plan continue DN to Rt posterior lumbar and hip as needed; continue work on core stabilization; progress exercise adding plank variations as tolerated at wall and functional strengthening    PT Home Exercise Plan [email or web address]    Consulted and Agree with Plan of Care Patient           Patient will benefit from skilled therapeutic intervention in order to improve the following deficits and impairments:     Visit Diagnosis: Chronic right-sided low back pain with right-sided sciatica  Other symptoms and signs involving the musculoskeletal system  Abnormal posture     Problem List Patient Active Problem List   Diagnosis Date Noted  . Hot flashes, menopausal 09/03/2020  . Lumbar degenerative disc disease 09/03/2020  . Primary osteoarthritis of both hips 09/03/2020  . Greater trochanteric bursitis of right hip 08/29/2020  . Elevated LDL cholesterol level 01/22/2020  . Acute right hip pain 01/14/2020  . Chronic bilateral low back pain without sciatica 01/14/2020  . Seasonal allergies 08/29/2018  . Elevated blood pressure reading 08/29/2018  . Allergic conjunctivitis of both eyes 12/02/2017  . 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 Seborrheic keratoses 08/01/2015  . BMI 30.0-30.9,adult 03/03/2015  . Class 1 obesity due to excess calories without serious comorbidity with body mass index (BMI) of 31.0 to 31.9 in adult 10/29/2014  . Anxiety 10/29/2014  . Migraine without aura and without status migrainosus, not intractable 10/25/2014    Lauren Modisette 10/27/2014 PT, MPH  09/25/2020, 4:01 PM  [facility] [address] [place], [place], [place] Phone: [phone]   Fax:   [phone]  Name: Jodi Perry MRN: Candace Gallus Date of Birth: 04-16-1961

## 2020-09-29 ENCOUNTER — Other Ambulatory Visit: Payer: Self-pay

## 2020-09-29 ENCOUNTER — Encounter: Payer: Self-pay | Admitting: Rehabilitative and Restorative Service Providers"

## 2020-09-29 ENCOUNTER — Ambulatory Visit (INDEPENDENT_AMBULATORY_CARE_PROVIDER_SITE_OTHER): Payer: BC Managed Care – PPO | Admitting: Sports Medicine

## 2020-09-29 ENCOUNTER — Ambulatory Visit (INDEPENDENT_AMBULATORY_CARE_PROVIDER_SITE_OTHER): Payer: BC Managed Care – PPO | Admitting: Rehabilitative and Restorative Service Providers"

## 2020-09-29 DIAGNOSIS — M545 Low back pain, unspecified: Secondary | ICD-10-CM

## 2020-09-29 DIAGNOSIS — M5441 Lumbago with sciatica, right side: Secondary | ICD-10-CM | POA: Diagnosis not present

## 2020-09-29 DIAGNOSIS — R29898 Other symptoms and signs involving the musculoskeletal system: Secondary | ICD-10-CM | POA: Diagnosis not present

## 2020-09-29 DIAGNOSIS — G8929 Other chronic pain: Secondary | ICD-10-CM | POA: Diagnosis not present

## 2020-09-29 DIAGNOSIS — R293 Abnormal posture: Secondary | ICD-10-CM | POA: Diagnosis not present

## 2020-09-29 NOTE — Progress Notes (Signed)
    Procedures performed today:    None.  Independent interpretation of notes and tests performed by another provider:   Lumbar spine x-rays personally reviewed, L5-S1 DDD is quite apparent, lesser so at other levels, there is also moderate facet arthritis.  Brief History, Exam, Impression, and Recommendations:    Chronic bilateral low back pain without sciatica This is a very pleasant 59 year old female, she has a long history of back pain on and off, more recently she had a flare, she got a shot with her PCP and improved. She has been appropriately placed into physical therapy. She is improving considerably, pain is axial, occasional radiation down the right leg, worse with standing and better with flexion all consistent with lumbar spinal stenosis/facet arthritis. Because she is doing so well with current medications, physical therapy, we will hold off on any aggressive or interventional treatment or advanced imaging. I did explain to her the anatomy, we went over imaging, I have also explained the prognosis and natural history of this disease. She can return to see [place] on an as-needed basis.    ___________________________________________ Ihor Austin. Benjamin Stain, M.D., ABFM., CAQSM. Primary Care and Sports Medicine [place] [facility]  Adjunct Instructor of Family Medicine  [employer]

## 2020-09-29 NOTE — Assessment & Plan Note (Signed)
This is a very pleasant 59 year old female, she has a long history of back pain on and off, more recently she had a flare, she got a shot with her PCP and improved. She has been appropriately placed into physical therapy. She is improving considerably, pain is axial, occasional radiation down the right leg, worse with standing and better with flexion all consistent with lumbar spinal stenosis/facet arthritis. Because she is doing so well with current medications, physical therapy, we will hold off on any aggressive or interventional treatment or advanced imaging. I did explain to her the anatomy, we went over imaging, I have also explained the prognosis and natural history of this disease. She can return to see [place] on an as-needed basis.

## 2020-09-29 NOTE — Patient Instructions (Signed)
Access Code: [email or web address]: [email or web address]: 10/18/2021Prepared by: Treg Diemer HoltExercises  Supine Transversus Abdominis Bracing with Pelvic Floor Contraction - 2 x daily - 7 x weekly - 1 sets - 10 reps - 10sec hold  Prone Press Up - 2 x daily - 7 x weekly - 1 sets - 10 reps - 2-3 sec hold  Supine Piriformis Stretch with Leg Straight - 2 x daily - 7 x weekly - 1 sets - 3 reps - 30 sec hold  Step Up - 2 x daily - 7 x weekly - 1 sets - 10 reps - 2 sec hold  Sit to Stand - 2 x daily - 7 x weekly - 1 sets - 10 reps - 3-5 sec hold  Wall Quarter Squat - 2 x daily - 7 x weekly - 1-2 sets - 10 reps - 5-10 sec hold  Standing Plank on Wall - 2 x daily - 7 x weekly - 1 sets - 3-5 reps - 30-60 sec hold  Plank on Counter - 2 x daily - 7 x weekly - 1 sets - 3 reps - 30-60 sec hold  Plank with Hip Extension on Counter - 2 x daily - 7 x weekly - 1 sets - 5-10 reps - 3-5 sec hold  Plank with Shoulder Flexion on Counter - 2 x daily - 7 x weekly - 1 sets - 5-10 reps - 3-5 sec hold  Standing Bilateral Low Shoulder Row with Anchored Resistance - 2 x daily - 7 x weekly - 1-3 sets - 10 reps - 2-3 sec hold  Shoulder Extension with Resistance - 2 x daily - 7 x weekly - 1-3 sets - 10 reps - 2-3 sec hold  Anti-Rotation Lateral Stepping with Press - 2 x daily - 7 x weekly - 1-2 sets - 10 reps - 2-3 sec hold  Quarter Squat with Resistance - 2 x daily - 7 x weekly - 1 sets - 10 reps - 3 sec hold  Forward T - 2 x daily - 7 x weekly - 1-2 sets - 10 reps - 2-3 sec hold

## 2020-09-29 NOTE — Therapy (Signed)
[facility] [place] [address] [place], [place], [address] Phone: [phone]   Fax:  [phone]  Physical Therapy Treatment  Patient Details  Name: Jodi Perry MRN: [id number] Date of Birth: 31-Mar-1961 Referring Provider (PT): Tandy Gaw, [place]    Encounter Date: 09/29/2020   PT End of Session - 09/29/20 1528    Visit Number 6    Number of Visits 12    Date for PT Re-Evaluation 10/17/20    PT Start Time 1520    PT Stop Time 1608    PT Time Calculation (min) 48 min    Activity Tolerance Patient tolerated treatment well           Past Medical History:  Diagnosis Date  . BPPV (benign paroxysmal positional vertigo) 07/15/2015  . Depression 10/29/2014  . Migraine without aura and without status migrainosus, not intractable 10/25/2014    Past Surgical History:  Procedure Laterality Date  . TUBAL LIGATION      There were no vitals filed for this visit.   Subjective Assessment - 09/29/20 1524    Subjective Np pain today - but had increased pain in the posterior hip and LB after last needling. Hurt all night and was sore the next day. Does not want to try the DN again. She was seen by orthopedist today. he says she does not have a disc between the vertebrae in lower back but he is not changing anything. Patient has been exercising.    Currently in Pain? No/denies    Pain Score 0-No pain    Pain Location Back                             [facility] Adult PT Treatment/Exercise - 09/29/20 0001      Lumbar Exercises: Stretches   Hip Flexor Stretch Right;Left;2 reps;30 seconds   sitting    Standing Extension 1 rep;5 seconds      Lumbar Exercises: Aerobic   Nustep L5 x 7 min UE 10       Lumbar Exercises: Standing   Functional Squats 20 reps;3 seconds   using blue TB around hands to increase LE work    [employer];Theraband    Theraband Level (Row) Level 4 (Blue)    Shoulder  Extension Strengthening;Both;10 reps;Theraband    Theraband Level (Shoulder Extension) Level 4 (Blue)    Other Standing Lumbar Exercises antirotation blue TB x 10 each side     Other Standing Lumbar Exercises plank at counter x 30 sec x 2 reps; counter plank with alternate shoulder flexion x 5 each UE; counter plank with alternate hip ext x 10 reps each LE       Lumbar Exercises: Seated   Sit to Stand 10 reps   cues to controlled descent      Knee/Hip Exercises: Standing   Lateral Step Up Right;Left;10 reps;Hand Hold: 1;Step Height: 4";Step Height: 6"   Lt 4" step Rt 6" step    Forward Step Up Right;1 set;10 reps;Both;Step Height: 6";Hand Hold: 2   cues to not lean into Rt hip; improved tolerance.    Forward Step Up Limitations retrostep                   PT Education - 09/29/20 1549    Education Details HEP    Person(s) Educated Patient    Methods Explanation;Demonstration;Tactile cues;Verbal cues;Handout    Comprehension Verbalized understanding;Returned demonstration;Verbal cues required;Tactile cues  required               PT Long Term Goals - 09/18/20 1455      PT LONG TERM GOAL #1   Title Decrease pain in the Rt LB and LE by 50-75% allowing patient to participate in exercise, ADL's and work tasks with minimal pain or difficulty    Baseline 45% reduction - 09/18/20    Time 6    Period Weeks    Status On-going      PT LONG TERM GOAL #2   Title Increase trunk and LE mobility with improved tissue extensibility through the tight tissues of Rt hip demonstrated by elongated stretch position for LB and hip musculature    Baseline -    Time 6    Period Weeks    Status On-going      PT LONG TERM GOAL #3   Title Patient to report improved sleeping - with ability to sleep for 3-4 hours without awakening due to pain    Baseline -    Time 6    Period Weeks    Status Achieved      PT LONG TERM GOAL #4   Title Independent in HEP    Time 6    Period Weeks    Status  On-going      PT LONG TERM GOAL #5   Title Improve FOTO to </= 43% limitation    Time 6    Period Weeks    Status On-going                 Plan - 09/29/20 1529    Clinical Impression Statement Decreaesd LBP and improved exercise tolerance. Added exercises for core strengthening without difficulty. Added balance reach forward to work on golfers lift. Will benefit from work on lifting techniques.    Rehab Potential Good    PT Frequency 2x / week    PT Duration 6 weeks    PT Treatment/Interventions ADLs/Self Care Home Management;Aquatic Therapy;Cryotherapy;Electrical Stimulation;Iontophoresis 4mg /ml Dexamethasone;Moist Heat;Ultrasound;Functional mobility training;Therapeutic activities;Therapeutic exercise;Balance training;Neuromuscular re-education;Patient/family education;Manual techniques;Dry needling;Taping    PT Next Visit Plan continue myofacial release to Rt posterior lumbar and hip as needed; continue work on core stabilization; functional strengthening; add lifting with hinged hip    PT Home Exercise Plan [email or web address]    Consulted and Agree with Plan of Care Patient           Patient will benefit from skilled therapeutic intervention in order to improve the following deficits and impairments:     Visit Diagnosis: Chronic right-sided low back pain with right-sided sciatica  Other symptoms and signs involving the musculoskeletal system  Abnormal posture     Problem List Patient Active Problem List   Diagnosis Date Noted  . Hot flashes, menopausal 09/03/2020  . Lumbar degenerative disc disease 09/03/2020  . Primary osteoarthritis of both hips 09/03/2020  . Greater trochanteric bursitis of right hip 08/29/2020  . Elevated LDL cholesterol level 01/22/2020  . Acute right hip pain 01/14/2020  . Chronic bilateral low back pain without sciatica 01/14/2020  . Seasonal allergies 08/29/2018  . Elevated blood pressure reading 08/29/2018  . Allergic conjunctivitis of both  eyes 12/02/2017  . 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 Seborrheic keratoses 08/01/2015  . BMI 30.0-30.9,adult 03/03/2015  . Class 1 obesity due to excess calories without serious comorbidity with body mass index (BMI) of 31.0 to 31.9 in adult 10/29/2014  . Anxiety 10/29/2014  . Migraine without aura and without status  migrainosus, not intractable 10/25/2014    Ayad Nieman Rober Minion PT, MPH  09/29/2020, 4:04 PM  [facility] [address] [place], [place], [address] Phone: [phone]   Fax:  [phone]  Name: Jodi Perry MRN: [id number] Date of Birth: 10-Dec-1961

## 2020-10-02 ENCOUNTER — Ambulatory Visit (INDEPENDENT_AMBULATORY_CARE_PROVIDER_SITE_OTHER): Payer: BC Managed Care – PPO | Admitting: Rehabilitative and Restorative Service Providers"

## 2020-10-02 ENCOUNTER — Encounter: Payer: Self-pay | Admitting: Rehabilitative and Restorative Service Providers"

## 2020-10-02 ENCOUNTER — Other Ambulatory Visit: Payer: Self-pay

## 2020-10-02 DIAGNOSIS — R293 Abnormal posture: Secondary | ICD-10-CM

## 2020-10-02 DIAGNOSIS — M5441 Lumbago with sciatica, right side: Secondary | ICD-10-CM

## 2020-10-02 DIAGNOSIS — G8929 Other chronic pain: Secondary | ICD-10-CM

## 2020-10-02 DIAGNOSIS — R29898 Other symptoms and signs involving the musculoskeletal system: Secondary | ICD-10-CM | POA: Diagnosis not present

## 2020-10-02 NOTE — Patient Instructions (Signed)
Access Code: [email or web address]: [email or web address]: 10/21/2021Prepared by: Kassondra Geil HoltExercises  Seated Cervical Retraction - 2 x daily - 7 x weekly - 1-2 sets - 5-10 reps - 10 sec hold  Supine Cervical Retraction with Towel - 2 x daily - 7 x weekly - 1 sets - 3 reps - 30 sec hold  Seated Scapular Retraction - 2 x daily - 7 x weekly - 1-2 sets - 10 reps - 10 sec hold  Shoulder External Rotation and Scapular Retraction - 2 x daily - 7 x weekly - 1 sets - 3 reps - 30 sec hold  Shoulder External Rotation in 45 Degrees Abduction - 2 x daily - 7 x weekly - 1-2 sets - 10 reps - 3 sec hold  Doorway Pec Stretch at 60 Degrees Abduction - 3 x daily - 7 x weekly - 3 reps - 1 sets  Doorway Pec Stretch at 90 Degrees Abduction - 3 x daily - 7 x weekly - 3 reps - 1 sets - 30 seconds hold  Doorway Pec Stretch at 120 Degrees Abduction - 3 x daily - 7 x weekly - 3 reps - 1 sets - 30 second hold hold  Seated Thoracic Lumbar Extension with Pectoralis Stretch - 2 x daily - 7 x weekly - 1 sets - 2 reps - 10 sec hold  Supine Thoracic Mobilization Towel Roll Vertical with Arm Stretch - 1 x daily - 7 x weekly - 1 sets - 2 reps - 10 min hold  Standing Shoulder External Rotation with Resistance - 2 x daily - 7 x weekly - 1-3 sets - 10 reps - 2-3 sec hold  Standing Bilateral Low Shoulder Row with Anchored Resistance - 2 x daily - 7 x weekly - 1-3 sets - 10 reps - 2-3 sec hold  Shoulder Extension with Resistance - 2 x daily - 7 x weekly - 1-3 sets - 10 reps - 2-3 sec hold  Anti-Rotation Lateral Stepping with Press - 2 x daily - 7 x weekly - 1-2 sets - 10 reps - 2-3 sec hold  Seated Cervical Sidebending Stretch - 2 x daily - 7 x weekly - 1 sets - 3 reps - 30 sec hold  Supine Double Knee to Chest - 2 x daily - 7 x weekly - 1 sets - 3 reps - 30 sec hold  Cat-Camel - 2 x daily - 7 x weekly - 1 sets - 5-10 reps - 3-5 sec hold  Cat-Camel to Child's Pose - 2 x daily - 7 x weekly - 1 sets - 3 reps - 30 sec hold   Beginner Front Arm Support - 2 x daily - 7 x weekly - 1 sets - 10 reps - 3 sec hold  Quadruped Alternating Arm Lift - 2 x daily - 7 x weekly - 1 sets - 10 reps - 3-5 sec hold  Bird Dog - 2 x daily - 7 x weekly - 1 sets - 10 reps - 2-3 sec hold  Supine ITB Stretch with Strap - 2 x daily - 7 x weekly - 1 sets - 3 reps - 30 sec hold  Supine Figure 4 Piriformis Stretch - 2 x daily - 7 x weekly - 1 sets - 3 reps - 30 sec hold  Bridge - 2 x daily - 7 x weekly - 1-2 sets - 10 reps - 5 sec hold  Supine Bridge with Resistance Band - 2 x daily - 7 x weekly - 1-2 sets - 10  reps - 5-10 sec hold  Hooklying Isometric Clamshell - 2 x daily - 7 x weekly - 1 sets - 10 reps - 3 sec hold  Supine Lower Trunk Rotation - 2 x daily - 7 x weekly - 1 sets - 3-5 reps - 20-30 sec hold  Sit to Stand - 2 x daily - 7 x weekly - 1 sets - 10 reps - 3-5 sec hold  Mini Squat - 2 x daily - 7 x weekly - 1 sets - 5-10 reps - 5-10 sec hold  Deadlift with Resistance - 2 x daily - 7 x weekly - 1 sets - 10 reps - 5-10 sec hold

## 2020-10-02 NOTE — Therapy (Addendum)
[place], [place], [address] Phone: [phone]   Fax:  [phone]  Physical Therapy Treatment and Discharge  Patient Details  Name: Jodi Perry MRN: [id number] Date of Birth: Apr 16, 1961 Referring Provider (PT): Iran Planas, [place]    Encounter Date: 10/02/2020   PT End of Session - 10/02/20 1430    Visit Number 7    Number of Visits 12    Date for PT Re-Evaluation 10/17/20    PT Start Time 1430    PT Stop Time 1515    PT Time Calculation (min) 45 min    Activity Tolerance Patient tolerated treatment well           Past Medical History:  Diagnosis Date  . BPPV (benign paroxysmal positional vertigo) 07/15/2015  . Depression 10/29/2014  . Migraine without aura and without status migrainosus, not intractable 10/25/2014    Past Surgical History:  Procedure Laterality Date  . TUBAL LIGATION      There were no vitals filed for this visit.   Subjective Assessment - 10/02/20 1431    Subjective A little stiff this morning but better after stretching. Working on her exercises at home. Please with progress. Wants some more stretches for her hips    Currently in Pain? No/denies    Pain Score 0-No pain              [facility] PT Assessment - 10/02/20 0001      Assessment   Medical Diagnosis Chronic LBP; Rt hip and LE pain     Referring Provider (PT) Iran Planas, PA-C     Onset Date/Surgical Date 02/11/20    Hand Dominance Right    Next MD Visit 09/29/20 Dr T     Prior Therapy yes for vertigo       Observation/Other Assessments   Focus on Therapeutic Outcomes (FOTO)  18% limitation       AROM   Overall AROM Comments no pain with lumbar or hip ROM     Right/Left Hip --   WFL's    Lumbar Flexion 90%     Lumbar Extension 70%    Lumbar - Right Side Bend 80%    Lumbar - Left Side Bend 80%     Lumbar - Right Rotation 55%     Lumbar - Left Rotation 55%      Strength   Overall  Strength Comments WFL's bilat LE's - weak core       Flexibility   Hamstrings WFL's slightly tighter Rt than Lt     Quadriceps WFL's bilat     ITB tight Rt     Piriformis tight Rt       Palpation   Spinal mobility hypomobile lumbar spine with CPA mobs     Palpation comment decreased muscular tightness Rt psoas; QL; gluts; piriformis                          [facility] Adult PT Treatment/Exercise - 10/02/20 0001      Lumbar Exercises: Stretches   Double Knee to Chest Stretch 3 reps;30 seconds    ITB Stretch Right;Left;2 reps;30 seconds   supine with strap    Piriformis Stretch Right;Left;3 reps;30 seconds    Figure 4 Stretch 3 reps;30 seconds;Supine;With overpressure    Other Lumbar Stretch Exercise trunk rotation supine 30 sec x 2 reps each side       Lumbar Exercises: Aerobic  Nustep L6 x 6 min UE 10       Lumbar Exercises: Standing   Functional Squats 20 reps;3 seconds   using blue TB around hands to increase LE work    Designer, television/film set From floor;From 12";5 reps;10 reps;2 seconds;3 seconds;Weights    Lifting Weights (lbs) 5,10      Lumbar Exercises: Seated   Sit to Stand 10 reps   cues to controlled descent      Lumbar Exercises: Quadruped   Madcat/Old Horse 5 reps    Single Arm Raise Right;Left;5 reps    Straight Leg Raise 5 reps;3 seconds    Opposite Arm/Leg Raise Right arm/Left leg;Left arm/Right leg;5 reps;3 seconds      Knee/Hip Exercises: Standing   Lateral Step Up Right;Left;10 reps;Hand Hold: 1;Step Height: 4";Step Height: 6"   Lt 4" step Rt 6" step    Forward Step Up Right;1 set;10 reps;Both;Step Height: 6";Hand Hold: 2   cues to not lean into Rt hip; improved tolerance.    Forward Step Up Limitations retrostep                   PT Education - 10/02/20 1522    Education Details HEP    Person(s) Educated Patient    Methods Explanation;Demonstration;Tactile cues;Verbal cues;Handout    Comprehension Verbalized understanding;Returned  demonstration;Verbal cues required;Tactile cues required               PT Long Term Goals - 10/02/20 1531      PT LONG TERM GOAL #1   Title Decrease pain in the Rt LB and LE by 50-75% allowing patient to participate in exercise, ADL's and work tasks with minimal pain or difficulty    Baseline -    Time 6    Period Weeks    Status Achieved      PT LONG TERM GOAL #2   Title Increase trunk and LE mobility with improved tissue extensibility through the tight tissues of Rt hip demonstrated by elongated stretch position for LB and hip musculature    Time 6    Period Weeks    Status Achieved      PT LONG TERM GOAL #3   Time 6    Period Weeks    Status Achieved      PT LONG TERM GOAL #4   Title Independent in HEP    Time 6    Period Weeks    Status Achieved      PT LONG TERM GOAL #5   Title Improve FOTO to </= 43% limitation    Time 6    Period Weeks    Status Achieved                 Plan - 10/02/20 1447    Clinical Impression Statement Continued improvement. Trunk and LE mobility WFL's. Goals of therapy have been accomplished. Patient reports good increase in functional activities and is preforming work activities. She is independent in HEP. Will place pt on hold for 4 weeks - she will call with any questions or problems.    Rehab Potential Good    PT Frequency 2x / week    PT Duration 6 weeks    PT Treatment/Interventions ADLs/Self Care Home Management;Aquatic Therapy;Cryotherapy;Electrical Stimulation;Iontophoresis 5m/ml Dexamethasone;Moist Heat;Ultrasound;Functional mobility training;Therapeutic activities;Therapeutic exercise;Balance training;Neuromuscular re-education;Patient/family education;Manual techniques;Dry needling;Taping    PT Next Visit Plan patient will continue with independent HEP and call with any questions or problems    PT Home Exercise Plan [id number]  Consulted and Agree with Plan of Care Patient           Patient will benefit from  skilled therapeutic intervention in order to improve the following deficits and impairments:     Visit Diagnosis: Chronic right-sided low back pain with right-sided sciatica  Other symptoms and signs involving the musculoskeletal system  Abnormal posture     Problem List Patient Active Problem List   Diagnosis Date Noted  . Hot flashes, menopausal 09/03/2020  . Lumbar degenerative disc disease 09/03/2020  . Primary osteoarthritis of both hips 09/03/2020  . Greater trochanteric bursitis of right hip 08/29/2020  . Elevated LDL cholesterol level 01/22/2020  . Acute right hip pain 01/14/2020  . Chronic bilateral low back pain without sciatica 01/14/2020  . Seasonal allergies 08/29/2018  . Elevated blood pressure reading 08/29/2018  . Allergic conjunctivitis of both eyes 12/02/2017  . 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 Seborrheic keratoses 08/01/2015  . BMI 30.0-30.9,adult 03/03/2015  . Class 1 obesity due to excess calories without serious comorbidity with body mass index (BMI) of 31.0 to 31.9 in adult 10/29/2014  . Anxiety 10/29/2014  . Migraine without aura and without status migrainosus, not intractable 10/25/2014   PHYSICAL THERAPY DISCHARGE SUMMARY  Visits from Start of Care: 7  Current functional level related to goals / functional outcomes: Goals achieved   Remaining deficits: See above   Education / Equipment: HEP Plan: Patient agrees to discharge.  Patient goals were met. Patient is being discharged due to meeting the stated rehab goals.  ?????       DONAWERTH,KAREN, PT  Leafy Motsinger Nilda Simmer PT, MPH  10/02/2020, 3:33 PM  [facility] [place], [place], [address] Phone: [phone]   Fax:  [phone]  Name: Emmarose Klinke MRN: [id number] Date of Birth: 1961/08/16

## 2020-10-08 DIAGNOSIS — J301 Allergic rhinitis due to pollen: Secondary | ICD-10-CM | POA: Diagnosis not present

## 2020-10-09 DIAGNOSIS — J301 Allergic rhinitis due to pollen: Secondary | ICD-10-CM | POA: Diagnosis not present

## 2020-10-15 ENCOUNTER — Other Ambulatory Visit: Payer: Self-pay | Admitting: Physician Assistant

## 2020-10-15 DIAGNOSIS — G8929 Other chronic pain: Secondary | ICD-10-CM

## 2020-10-15 DIAGNOSIS — M25551 Pain in right hip: Secondary | ICD-10-CM

## 2020-10-15 DIAGNOSIS — M545 Low back pain, unspecified: Secondary | ICD-10-CM

## 2020-10-22 DIAGNOSIS — J301 Allergic rhinitis due to pollen: Secondary | ICD-10-CM | POA: Diagnosis not present

## 2020-11-12 DIAGNOSIS — J301 Allergic rhinitis due to pollen: Secondary | ICD-10-CM | POA: Diagnosis not present

## 2020-11-19 DIAGNOSIS — J301 Allergic rhinitis due to pollen: Secondary | ICD-10-CM | POA: Diagnosis not present

## 2020-11-26 DIAGNOSIS — J301 Allergic rhinitis due to pollen: Secondary | ICD-10-CM | POA: Diagnosis not present

## 2020-12-11 DIAGNOSIS — J301 Allergic rhinitis due to pollen: Secondary | ICD-10-CM | POA: Diagnosis not present

## 2020-12-18 DIAGNOSIS — J301 Allergic rhinitis due to pollen: Secondary | ICD-10-CM | POA: Diagnosis not present

## 2020-12-24 DIAGNOSIS — J301 Allergic rhinitis due to pollen: Secondary | ICD-10-CM | POA: Diagnosis not present

## 2021-01-07 DIAGNOSIS — J301 Allergic rhinitis due to pollen: Secondary | ICD-10-CM | POA: Diagnosis not present

## 2021-01-14 DIAGNOSIS — J301 Allergic rhinitis due to pollen: Secondary | ICD-10-CM | POA: Diagnosis not present

## 2021-01-21 DIAGNOSIS — J301 Allergic rhinitis due to pollen: Secondary | ICD-10-CM | POA: Diagnosis not present

## 2021-01-28 DIAGNOSIS — J301 Allergic rhinitis due to pollen: Secondary | ICD-10-CM | POA: Diagnosis not present

## 2021-02-19 DIAGNOSIS — J301 Allergic rhinitis due to pollen: Secondary | ICD-10-CM | POA: Diagnosis not present

## 2021-02-23 ENCOUNTER — Other Ambulatory Visit: Payer: Self-pay | Admitting: Physician Assistant

## 2021-02-23 DIAGNOSIS — G43009 Migraine without aura, not intractable, without status migrainosus: Secondary | ICD-10-CM

## 2021-02-25 DIAGNOSIS — J301 Allergic rhinitis due to pollen: Secondary | ICD-10-CM | POA: Diagnosis not present

## 2021-02-27 ENCOUNTER — Encounter: Payer: Self-pay | Admitting: Physician Assistant

## 2021-02-27 ENCOUNTER — Other Ambulatory Visit: Payer: Self-pay

## 2021-02-27 ENCOUNTER — Ambulatory Visit: Payer: BC Managed Care – PPO | Admitting: Physician Assistant

## 2021-02-27 VITALS — BP 154/90 | HR 67 | Ht 62.25 in | Wt 175.0 lb

## 2021-02-27 DIAGNOSIS — J0140 Acute pansinusitis, unspecified: Secondary | ICD-10-CM

## 2021-02-27 DIAGNOSIS — J302 Other seasonal allergic rhinitis: Secondary | ICD-10-CM

## 2021-02-27 DIAGNOSIS — H1013 Acute atopic conjunctivitis, bilateral: Secondary | ICD-10-CM

## 2021-02-27 DIAGNOSIS — Z131 Encounter for screening for diabetes mellitus: Secondary | ICD-10-CM

## 2021-02-27 DIAGNOSIS — Z1231 Encounter for screening mammogram for malignant neoplasm of breast: Secondary | ICD-10-CM

## 2021-02-27 DIAGNOSIS — Z13220 Encounter for screening for lipoid disorders: Secondary | ICD-10-CM

## 2021-02-27 DIAGNOSIS — Z79899 Other long term (current) drug therapy: Secondary | ICD-10-CM | POA: Diagnosis not present

## 2021-02-27 DIAGNOSIS — J301 Allergic rhinitis due to pollen: Secondary | ICD-10-CM

## 2021-02-27 MED ORDER — METHYLPREDNISOLONE 4 MG PO TBPK: ORAL_TABLET | ORAL | 0 refills | Status: DC

## 2021-02-27 MED ORDER — AMOXICILLIN-POT CLAVULANATE 875-125 MG PO TABS: 1.0000 | ORAL_TABLET | Freq: Two times a day (BID) | ORAL | 0 refills | Status: DC

## 2021-02-27 NOTE — Progress Notes (Signed)
Subjective:    Patient ID: Jodi Perry, female    DOB: Jun 13, 1961, 60 y.o.   MRN: [id number]  HPI  Pt is a 60 yo female with seasonal allergies who presents to the clinic with worsening sinus pressure, congestion, sinus drainage, cough, chest tightness. She denies any fever, chills, body aches, changes in taste or smell, GI symptoms. She gets allergy shots weekly and on xyzal, allegra, flonase, pataday. Ongoing allergy symptoms worsening sinusitis symptoms.   .. Active Ambulatory Problems    Diagnosis Date Noted  . Migraine without aura and without status migrainosus, not intractable 10/25/2014  . Class 1 obesity due to excess calories without serious comorbidity with body mass index (BMI) of 31.0 to 31.9 in adult 10/29/2014  . Anxiety 10/29/2014  . BMI 30.0-30.9,adult 03/03/2015  . Seborrheic keratoses 08/01/2015  . 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 Allergic conjunctivitis of both eyes 12/02/2017  . Seasonal allergies 08/29/2018  . Elevated blood pressure reading 08/29/2018  . Acute right hip pain 01/14/2020  . Chronic bilateral low back pain without sciatica 01/14/2020  . Elevated LDL cholesterol level 01/22/2020  . Greater trochanteric bursitis of right hip 08/29/2020  . Hot flashes, menopausal 09/03/2020  . Lumbar degenerative disc disease 09/03/2020  . Primary osteoarthritis of both hips 09/03/2020   Resolved Ambulatory Problems    Diagnosis Date Noted  . MIGRAINE HEADACHE 09/08/2010  . Depression 10/29/2014  . Abnormal weight gain 10/29/2014  . Tetanus toxoid vaccination within the past 10 years 03/05/2015  . BPPV (benign paroxysmal positional vertigo) 07/15/2015  . Thumb pain 12/30/2015   No Additional Past Medical History       Review of Systems See HPI.     Objective:   Physical Exam Vitals reviewed.  Constitutional:      Appearance: Normal appearance.  HENT:     Head: Normocephalic.     Right Ear: There is no impacted cerumen.     Left Ear: There  is no impacted cerumen.     Ears:     Comments: Erythematous TMs.     Nose: Congestion present.     Mouth/Throat:     Mouth: Mucous membranes are moist.     Comments: Sinus drainage oropharynx.  Eyes:     Extraocular Movements: Extraocular movements intact.     Conjunctiva/sclera: Conjunctivae normal.     Pupils: Pupils are equal, round, and reactive to light.  Cardiovascular:     Rate and Rhythm: Normal rate and regular rhythm.     Pulses: Normal pulses.  Pulmonary:     Effort: Pulmonary effort is normal.     Breath sounds: Normal breath sounds. No wheezing or rhonchi.  Lymphadenopathy:     Cervical: No cervical adenopathy.  Neurological:     General: No focal deficit present.     Mental Status: She is alert and oriented to person, place, and time.  Psychiatric:        Mood and Affect: Mood normal.           Assessment & Plan:  Marland KitchenMarland KitchenKatrese was seen today for follow-up.  Diagnoses and all orders for this visit:  Acute non-recurrent pansinusitis -     methylPREDNISolone (MEDROL DOSEPAK) 4 MG TBPK tablet; Take as directed by package insert. -     amoxicillin-clavulanate (AUGMENTIN) 875-125 MG tablet; Take 1 tablet by mouth 2 (two) times daily.  Screening for lipid disorders -     Lipid Panel w/reflex Direct LDL  Screening for diabetes mellitus -  COMPLETE METABOLIC PANEL WITH GFR  Medication management -     COMPLETE METABOLIC PANEL WITH GFR -     Lipid Panel w/reflex Direct LDL -     CBC with Differential/Platelet  Encounter for screening mammogram for malignant neoplasm of breast -     MM 3D SCREEN BREAST BILATERAL  Seasonal allergies -     methylPREDNISolone (MEDROL DOSEPAK) 4 MG TBPK tablet; Take as directed by package insert.  Allergic conjunctivitis of both eyes -     methylPREDNISolone (MEDROL DOSEPAK) 4 MG TBPK tablet; Take as directed by package insert.  Seasonal allergic rhinitis due to pollen -     methylPREDNISolone (MEDROL DOSEPAK) 4 MG TBPK  tablet; Take as directed by package insert.   Discussed with patient how her allergies I think induce a sinus infection.  Started Augmentin and Medrol Dosepak.  Continue her allergy regimen of Allegra, Xyzal, Flonase, Pataday.  Encourage nasal sinus rinses.  Follow-up as needed or symptoms persist.  Patient does need some screening labs and mammogram.  Those were ordered today.

## 2021-03-04 DIAGNOSIS — J301 Allergic rhinitis due to pollen: Secondary | ICD-10-CM | POA: Diagnosis not present

## 2021-03-11 DIAGNOSIS — J301 Allergic rhinitis due to pollen: Secondary | ICD-10-CM | POA: Diagnosis not present

## 2021-03-18 ENCOUNTER — Other Ambulatory Visit: Payer: Self-pay | Admitting: Physician Assistant

## 2021-03-18 DIAGNOSIS — M7061 Trochanteric bursitis, right hip: Secondary | ICD-10-CM

## 2021-03-18 DIAGNOSIS — G8929 Other chronic pain: Secondary | ICD-10-CM

## 2021-03-18 DIAGNOSIS — M545 Low back pain, unspecified: Secondary | ICD-10-CM

## 2021-03-18 DIAGNOSIS — J301 Allergic rhinitis due to pollen: Secondary | ICD-10-CM | POA: Diagnosis not present

## 2021-03-25 DIAGNOSIS — J301 Allergic rhinitis due to pollen: Secondary | ICD-10-CM | POA: Diagnosis not present

## 2021-04-01 DIAGNOSIS — J301 Allergic rhinitis due to pollen: Secondary | ICD-10-CM | POA: Diagnosis not present

## 2021-04-08 DIAGNOSIS — J301 Allergic rhinitis due to pollen: Secondary | ICD-10-CM | POA: Diagnosis not present

## 2021-04-09 DIAGNOSIS — J301 Allergic rhinitis due to pollen: Secondary | ICD-10-CM | POA: Diagnosis not present

## 2021-04-11 ENCOUNTER — Other Ambulatory Visit: Payer: Self-pay | Admitting: Physician Assistant

## 2021-04-11 DIAGNOSIS — H1013 Acute atopic conjunctivitis, bilateral: Secondary | ICD-10-CM

## 2021-04-11 DIAGNOSIS — J302 Other seasonal allergic rhinitis: Secondary | ICD-10-CM

## 2021-04-14 ENCOUNTER — Other Ambulatory Visit: Payer: Self-pay | Admitting: Physician Assistant

## 2021-04-14 DIAGNOSIS — G8929 Other chronic pain: Secondary | ICD-10-CM

## 2021-04-14 DIAGNOSIS — J302 Other seasonal allergic rhinitis: Secondary | ICD-10-CM

## 2021-04-14 DIAGNOSIS — H1013 Acute atopic conjunctivitis, bilateral: Secondary | ICD-10-CM

## 2021-04-14 DIAGNOSIS — M545 Low back pain, unspecified: Secondary | ICD-10-CM

## 2021-04-14 DIAGNOSIS — M25551 Pain in right hip: Secondary | ICD-10-CM

## 2021-04-14 DIAGNOSIS — G43009 Migraine without aura, not intractable, without status migrainosus: Secondary | ICD-10-CM

## 2021-04-15 DIAGNOSIS — J301 Allergic rhinitis due to pollen: Secondary | ICD-10-CM | POA: Diagnosis not present

## 2021-04-23 DIAGNOSIS — J301 Allergic rhinitis due to pollen: Secondary | ICD-10-CM | POA: Diagnosis not present

## 2021-05-07 DIAGNOSIS — J301 Allergic rhinitis due to pollen: Secondary | ICD-10-CM | POA: Diagnosis not present

## 2021-05-13 DIAGNOSIS — J301 Allergic rhinitis due to pollen: Secondary | ICD-10-CM | POA: Diagnosis not present

## 2021-05-28 DIAGNOSIS — J301 Allergic rhinitis due to pollen: Secondary | ICD-10-CM | POA: Diagnosis not present

## 2021-06-03 DIAGNOSIS — J301 Allergic rhinitis due to pollen: Secondary | ICD-10-CM | POA: Diagnosis not present

## 2021-06-11 DIAGNOSIS — J301 Allergic rhinitis due to pollen: Secondary | ICD-10-CM | POA: Diagnosis not present

## 2021-06-15 IMAGING — DX DG HIP (WITH OR WITHOUT PELVIS) 2-3V*R*
3 series · 3 of 3 positions shown · non-contrast
Comparison: No prior.

CLINICAL DATA: Right hip pain.

EXAM:
DG HIP (WITH OR WITHOUT PELVIS) 2-3V RIGHT

[pelvis ap]
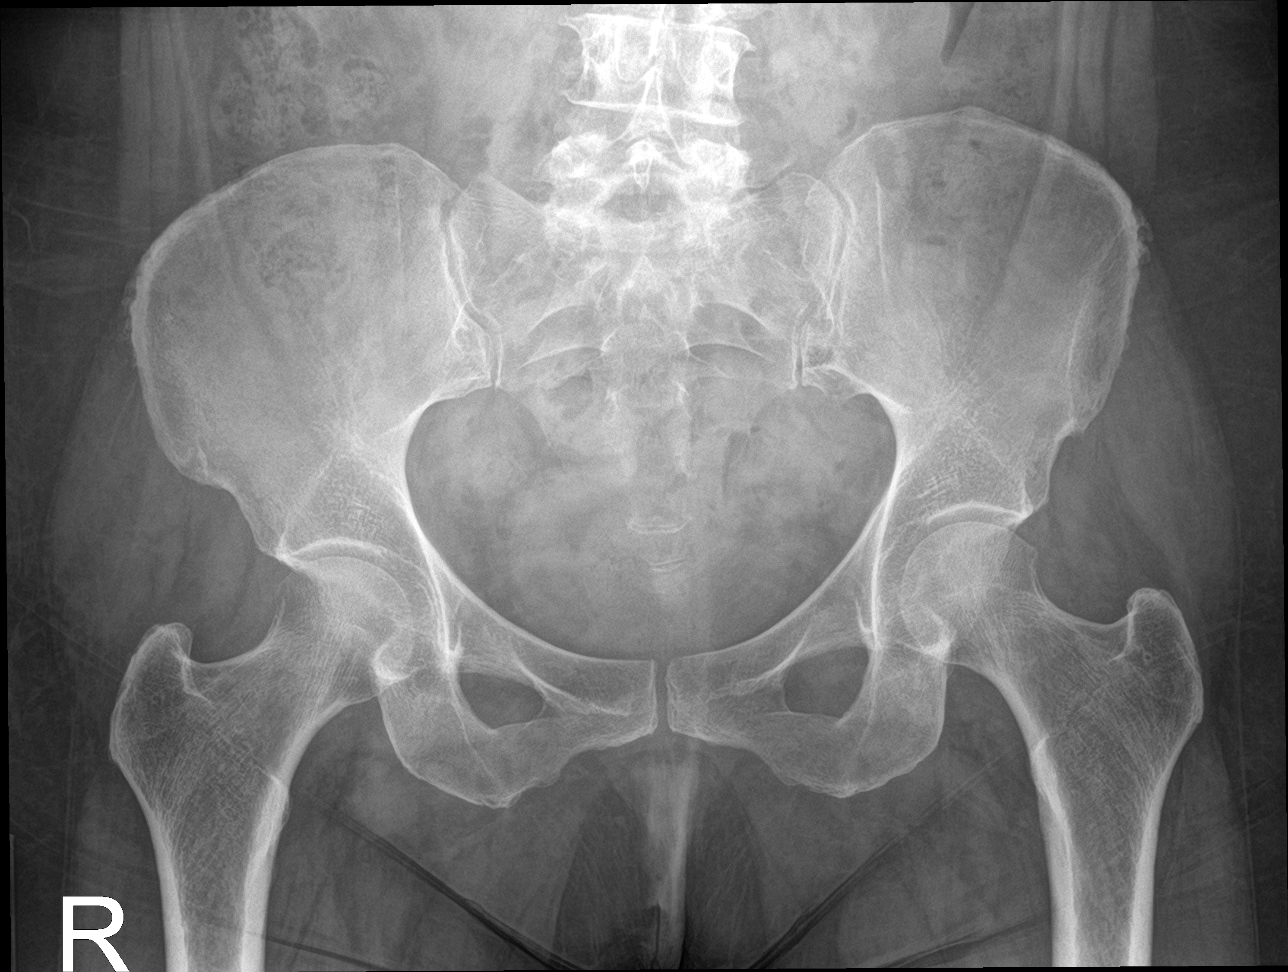

[hip ap]
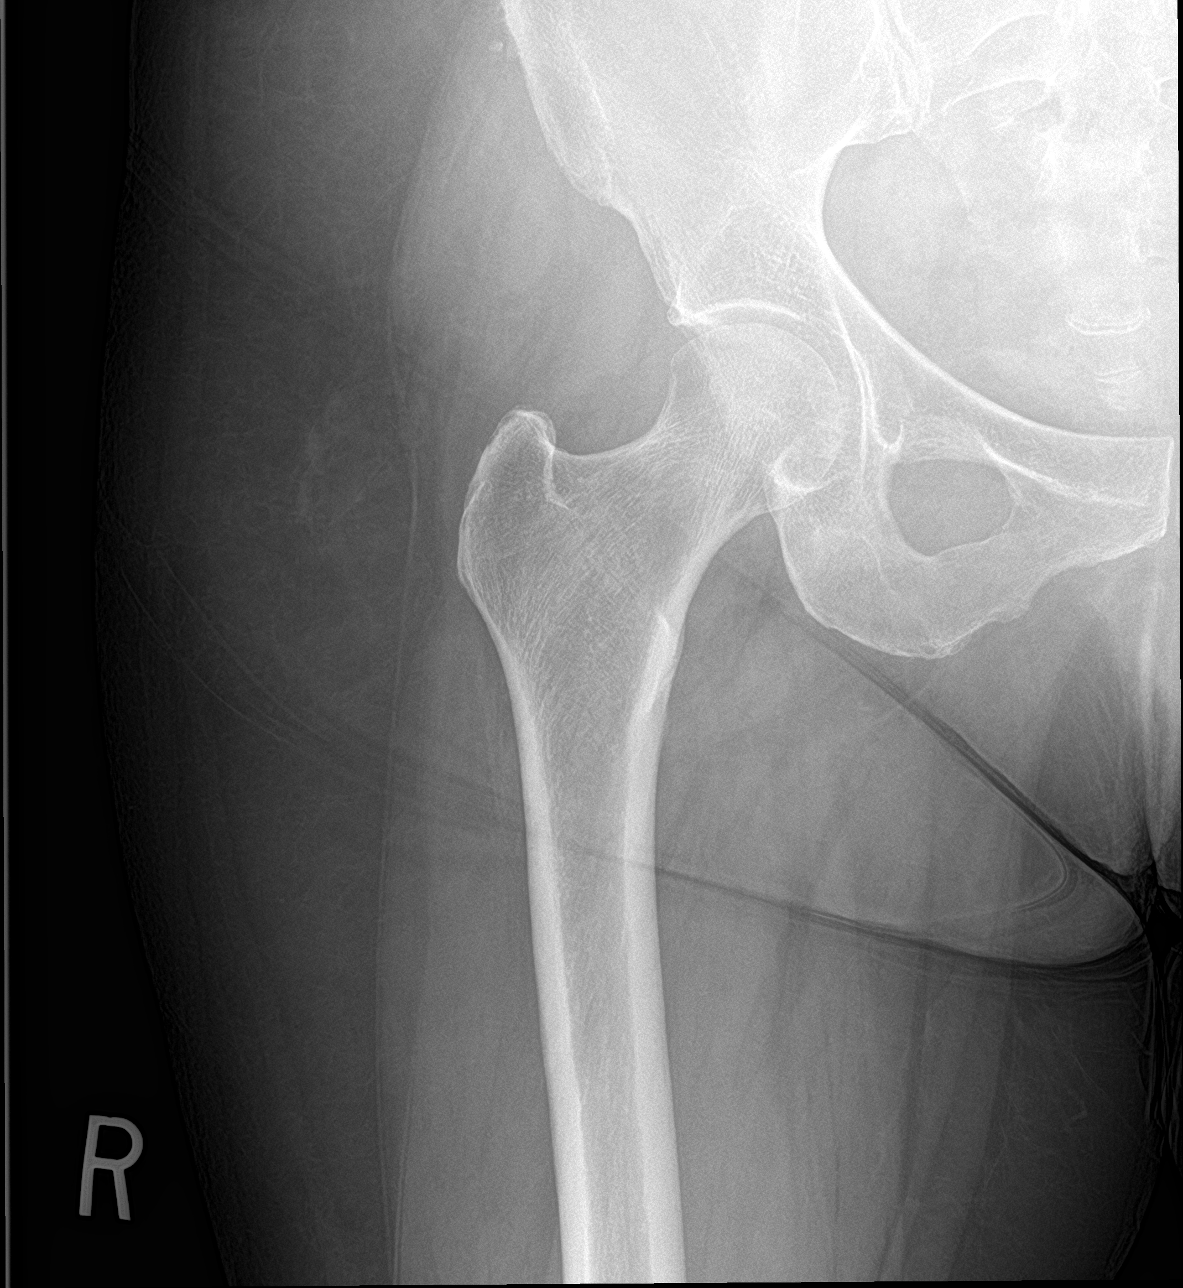

[hip lat]
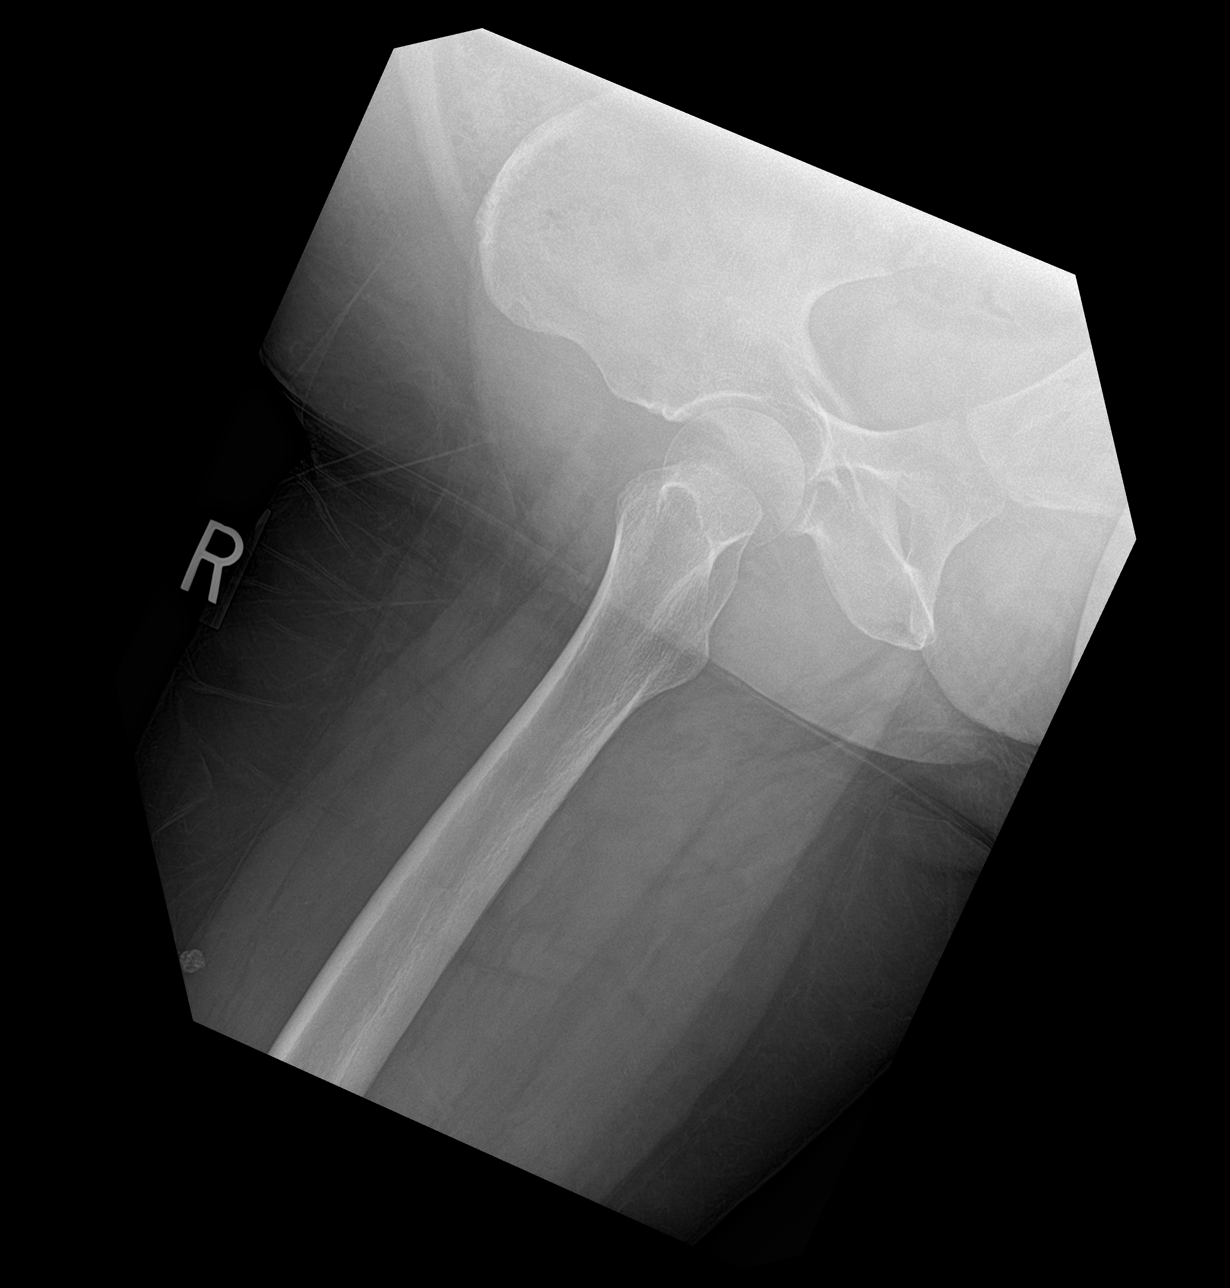

[3 of 3 positions shown; findings below may reference images not displayed]

FINDINGS: Prominent degenerative changes lower lumbar spine. Mild degenerative
changes noted about both hips. No acute bony or joint abnormality
identified. Right hip is intact.
IMPRESSION: Degenerative changes lumbar spine. Mild degenerative changes both
hips. No acute bony abnormality identified. Right hip is intact.

## 2021-06-26 ENCOUNTER — Other Ambulatory Visit: Payer: Self-pay | Admitting: Physician Assistant

## 2021-06-26 DIAGNOSIS — H1013 Acute atopic conjunctivitis, bilateral: Secondary | ICD-10-CM

## 2021-06-26 DIAGNOSIS — G8929 Other chronic pain: Secondary | ICD-10-CM

## 2021-06-26 DIAGNOSIS — G43009 Migraine without aura, not intractable, without status migrainosus: Secondary | ICD-10-CM

## 2021-06-26 DIAGNOSIS — M25551 Pain in right hip: Secondary | ICD-10-CM

## 2021-06-26 DIAGNOSIS — M545 Low back pain, unspecified: Secondary | ICD-10-CM

## 2021-07-08 DIAGNOSIS — J301 Allergic rhinitis due to pollen: Secondary | ICD-10-CM | POA: Diagnosis not present

## 2021-07-08 DIAGNOSIS — J3089 Other allergic rhinitis: Secondary | ICD-10-CM | POA: Diagnosis not present

## 2021-07-13 ENCOUNTER — Other Ambulatory Visit: Payer: Self-pay | Admitting: Physician Assistant

## 2021-07-13 DIAGNOSIS — H1013 Acute atopic conjunctivitis, bilateral: Secondary | ICD-10-CM

## 2021-07-13 DIAGNOSIS — J302 Other seasonal allergic rhinitis: Secondary | ICD-10-CM

## 2021-07-25 ENCOUNTER — Other Ambulatory Visit: Payer: Self-pay | Admitting: Physician Assistant

## 2021-07-25 DIAGNOSIS — H1013 Acute atopic conjunctivitis, bilateral: Secondary | ICD-10-CM

## 2021-08-18 ENCOUNTER — Other Ambulatory Visit: Payer: Self-pay | Admitting: Physician Assistant

## 2021-08-18 DIAGNOSIS — H1013 Acute atopic conjunctivitis, bilateral: Secondary | ICD-10-CM

## 2021-08-18 DIAGNOSIS — J302 Other seasonal allergic rhinitis: Secondary | ICD-10-CM

## 2021-08-25 ENCOUNTER — Other Ambulatory Visit: Payer: Self-pay | Admitting: Physician Assistant

## 2021-08-25 DIAGNOSIS — H1013 Acute atopic conjunctivitis, bilateral: Secondary | ICD-10-CM

## 2021-08-31 ENCOUNTER — Other Ambulatory Visit: Payer: Self-pay | Admitting: Physician Assistant

## 2021-08-31 DIAGNOSIS — H1013 Acute atopic conjunctivitis, bilateral: Secondary | ICD-10-CM

## 2021-08-31 DIAGNOSIS — J302 Other seasonal allergic rhinitis: Secondary | ICD-10-CM

## 2021-08-31 DIAGNOSIS — G43009 Migraine without aura, not intractable, without status migrainosus: Secondary | ICD-10-CM

## 2021-09-19 ENCOUNTER — Other Ambulatory Visit: Payer: Self-pay | Admitting: Physician Assistant

## 2021-09-19 DIAGNOSIS — H1013 Acute atopic conjunctivitis, bilateral: Secondary | ICD-10-CM

## 2021-09-30 ENCOUNTER — Other Ambulatory Visit: Payer: Self-pay | Admitting: *Deleted

## 2021-09-30 ENCOUNTER — Other Ambulatory Visit: Payer: Self-pay | Admitting: Physician Assistant

## 2021-09-30 DIAGNOSIS — M7061 Trochanteric bursitis, right hip: Secondary | ICD-10-CM

## 2021-09-30 DIAGNOSIS — G8929 Other chronic pain: Secondary | ICD-10-CM

## 2021-10-01 ENCOUNTER — Other Ambulatory Visit: Payer: Self-pay | Admitting: Physician Assistant

## 2021-10-01 DIAGNOSIS — G43009 Migraine without aura, not intractable, without status migrainosus: Secondary | ICD-10-CM

## 2021-10-02 ENCOUNTER — Other Ambulatory Visit: Payer: Self-pay | Admitting: Physician Assistant

## 2021-10-02 DIAGNOSIS — J302 Other seasonal allergic rhinitis: Secondary | ICD-10-CM

## 2021-10-02 DIAGNOSIS — H1013 Acute atopic conjunctivitis, bilateral: Secondary | ICD-10-CM

## 2021-11-12 ENCOUNTER — Other Ambulatory Visit: Payer: Self-pay | Admitting: Physician Assistant

## 2021-11-12 DIAGNOSIS — M545 Low back pain, unspecified: Secondary | ICD-10-CM

## 2021-11-12 DIAGNOSIS — M25551 Pain in right hip: Secondary | ICD-10-CM

## 2021-11-12 DIAGNOSIS — G8929 Other chronic pain: Secondary | ICD-10-CM

## 2021-11-13 ENCOUNTER — Other Ambulatory Visit: Payer: Self-pay | Admitting: Physician Assistant

## 2021-11-13 DIAGNOSIS — M25551 Pain in right hip: Secondary | ICD-10-CM

## 2021-11-13 DIAGNOSIS — G8929 Other chronic pain: Secondary | ICD-10-CM

## 2021-11-23 ENCOUNTER — Encounter: Payer: Self-pay | Admitting: Physician Assistant

## 2021-11-23 ENCOUNTER — Ambulatory Visit: Payer: BC Managed Care – PPO | Admitting: Physician Assistant

## 2021-11-23 ENCOUNTER — Other Ambulatory Visit: Payer: Self-pay

## 2021-11-23 VITALS — BP 150/70 | HR 59 | Ht 62.25 in | Wt 172.0 lb

## 2021-11-23 DIAGNOSIS — Z131 Encounter for screening for diabetes mellitus: Secondary | ICD-10-CM | POA: Diagnosis not present

## 2021-11-23 DIAGNOSIS — J302 Other seasonal allergic rhinitis: Secondary | ICD-10-CM

## 2021-11-23 DIAGNOSIS — M7061 Trochanteric bursitis, right hip: Secondary | ICD-10-CM

## 2021-11-23 DIAGNOSIS — Z79899 Other long term (current) drug therapy: Secondary | ICD-10-CM | POA: Diagnosis not present

## 2021-11-23 DIAGNOSIS — Z13220 Encounter for screening for lipoid disorders: Secondary | ICD-10-CM | POA: Diagnosis not present

## 2021-11-23 DIAGNOSIS — R030 Elevated blood-pressure reading, without diagnosis of hypertension: Secondary | ICD-10-CM

## 2021-11-23 DIAGNOSIS — Z1211 Encounter for screening for malignant neoplasm of colon: Secondary | ICD-10-CM

## 2021-11-23 DIAGNOSIS — G43009 Migraine without aura, not intractable, without status migrainosus: Secondary | ICD-10-CM | POA: Diagnosis not present

## 2021-11-23 DIAGNOSIS — Z1231 Encounter for screening mammogram for malignant neoplasm of breast: Secondary | ICD-10-CM

## 2021-11-23 DIAGNOSIS — Z1329 Encounter for screening for other suspected endocrine disorder: Secondary | ICD-10-CM

## 2021-11-23 DIAGNOSIS — M25551 Pain in right hip: Secondary | ICD-10-CM

## 2021-11-23 DIAGNOSIS — M545 Low back pain, unspecified: Secondary | ICD-10-CM

## 2021-11-23 DIAGNOSIS — G8929 Other chronic pain: Secondary | ICD-10-CM

## 2021-11-23 DIAGNOSIS — H1013 Acute atopic conjunctivitis, bilateral: Secondary | ICD-10-CM

## 2021-11-23 MED ORDER — FEXOFENADINE HCL 180 MG PO TABS: 180.0000 mg | ORAL_TABLET | Freq: Every day | ORAL | 3 refills | Status: DC

## 2021-11-23 MED ORDER — MELOXICAM 15 MG PO TABS: 15.0000 mg | ORAL_TABLET | Freq: Every day | ORAL | 5 refills | Status: DC

## 2021-11-23 MED ORDER — GABAPENTIN 100 MG PO CAPS: ORAL_CAPSULE | ORAL | 3 refills | Status: DC

## 2021-11-23 MED ORDER — AZELASTINE HCL 0.05 % OP SOLN: OPHTHALMIC | 5 refills | Status: AC

## 2021-11-23 MED ORDER — LEVOCETIRIZINE DIHYDROCHLORIDE 5 MG PO TABS
5.0000 mg | ORAL_TABLET | Freq: Every evening | ORAL | 5 refills | Status: DC
Start: 2021-11-23 — End: 2022-03-08

## 2021-11-23 MED ORDER — METHOCARBAMOL 500 MG PO TABS: 500.0000 mg | ORAL_TABLET | Freq: Three times a day (TID) | ORAL | 5 refills | Status: DC

## 2021-11-23 MED ORDER — FLUTICASONE PROPIONATE 50 MCG/ACT NA SUSP: NASAL | 3 refills | Status: DC

## 2021-11-23 MED ORDER — SUMATRIPTAN SUCCINATE 100 MG PO TABS: ORAL_TABLET | ORAL | 5 refills | Status: DC

## 2021-11-23 NOTE — Progress Notes (Signed)
Subjective:    Patient ID: Jodi Perry, female    DOB: February 08, 1961, 60 y.o.   MRN: [id number]  HPI Pt is a 60 yo obese female with Migraines, lumbar DDD and OA of both hips, seasonal allergies and elevated LDL who presents to the clinic for 6 month follow up.   Overall patient is doing well. Continues to struggle with musculoskelatal pain from time to time. Medications do help. Migraines controlled. Using imitrex very rarely.   .. 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   Allergic conjunctivitis of both eyes 12/02/2017   Seasonal allergies 08/29/2018   Elevated blood pressure reading 08/29/2018   Acute right hip pain 01/14/2020   Chronic bilateral low back pain without sciatica 01/14/2020   Elevated LDL cholesterol level 01/22/2020   Greater trochanteric bursitis of right hip 08/29/2020   Hot flashes, menopausal 09/03/2020   Lumbar degenerative disc disease 09/03/2020   Primary osteoarthritis of both hips 09/03/2020   Resolved Ambulatory Problems    Diagnosis Date Noted   MIGRAINE HEADACHE 09/08/2010   Depression 10/29/2014   Abnormal weight gain 10/29/2014   Tetanus toxoid vaccination within the past 10 years 03/05/2015   BPPV (benign paroxysmal positional vertigo) 07/15/2015   Thumb pain 12/30/2015   No Additional Past Medical History    Review of Systems  All other systems reviewed and are negative.     Objective:   Physical Exam Vitals reviewed.  Constitutional:      Appearance: Normal appearance. She is obese.  HENT:     Head: Normocephalic.  Neck:     Vascular: No carotid bruit.  Cardiovascular:     Rate and  Rhythm: Normal rate and regular rhythm.     Pulses: Normal pulses.     Heart sounds: Normal heart sounds.  Pulmonary:     Effort: Pulmonary effort is normal.     Breath sounds: Normal breath sounds.  Musculoskeletal:     Right lower leg: No edema.     Left lower leg: No edema.  Lymphadenopathy:     Cervical: No cervical adenopathy.  Neurological:     General: No focal deficit present.     Mental Status: She is alert and oriented to person, place, and time.  Psychiatric:        Mood and Affect: Mood normal.      .. Depression screen [facility] 2/9 02/27/2021 02/22/2020 03/22/2017  Decreased Interest 0 0 0  Down, Depressed, Hopeless 0 0 0  PHQ - 2 Score 0 0 0  Altered sleeping 1 2 -  Tired, decreased energy 0 2 -  Change in appetite 0 0 -  Feeling bad or failure about yourself  0 0 -  Trouble concentrating 1 1 -  Moving slowly or fidgety/restless 0 0 -  Suicidal thoughts 0 0 -  PHQ-9 Score 2 5 -  Difficult doing work/chores Not difficult at all - -   .Marland Kitchen GAD 7 : Generalized Anxiety Score 02/27/2021 02/22/2020  Nervous, Anxious, on Edge 0 0  Control/stop worrying 0 0  Worry too much - different things 0 0  Trouble relaxing 0 0  Restless 0 0  Easily  annoyed or irritable 1 1  Afraid - awful might happen 0 0  Total GAD 7 Score 1 1  Anxiety Difficulty Not difficult at all Not difficult at all        Assessment & Plan:  Marland KitchenMarland KitchenJonnell was seen today for follow-up.  Diagnoses and all orders for this visit:  Migraine without aura and without status migrainosus, not intractable -     SUMAtriptan (IMITREX) 100 MG tablet; TAKE 1 TABLET FOR MIGRAINE MAY REPEAT IN 2 HRS IF HEADACHE PERSISTS  Screening for lipid disorders -     Lipid Panel w/reflex Direct LDL  Screening for diabetes mellitus -     COMPLETE METABOLIC PANEL WITH GFR  Medication management -     TSH -     Lipid Panel w/reflex Direct LDL -     COMPLETE METABOLIC PANEL WITH GFR -     CBC with  Differential/Platelet  Thyroid disorder screen -     TSH  Chronic bilateral low back pain without sciatica -     methocarbamol (ROBAXIN) 500 MG tablet; Take 1 tablet (500 mg total) by mouth 3 (three) times daily. appt for refills -     meloxicam (MOBIC) 15 MG tablet; Take 1 tablet (15 mg total) by mouth daily. -     gabapentin (NEURONTIN) 100 MG capsule; Take 1-3 tablets at bedtime for pain.  Acute right hip pain -     methocarbamol (ROBAXIN) 500 MG tablet; Take 1 tablet (500 mg total) by mouth 3 (three) times daily. appt for refills  Allergic conjunctivitis of both eyes -     azelastine (OPTIVAR) 0.05 % ophthalmic solution; PUT 1 DROP INTO BOTH EYES TWICE A DAY -     levocetirizine (XYZAL) 5 MG tablet; Take 1 tablet (5 mg total) by mouth every evening. -     fluticasone (FLONASE) 50 MCG/ACT nasal spray; SPRAY 2 SPRAYS INTO EACH NOSTRIL EVERY DAY -     fexofenadine (ALLEGRA) 180 MG tablet; Take 1 tablet (180 mg total) by mouth daily.  Seasonal allergies -     levocetirizine (XYZAL) 5 MG tablet; Take 1 tablet (5 mg total) by mouth every evening. -     fluticasone (FLONASE) 50 MCG/ACT nasal spray; SPRAY 2 SPRAYS INTO EACH NOSTRIL EVERY DAY -     fexofenadine (ALLEGRA) 180 MG tablet; Take 1 tablet (180 mg total) by mouth daily.  Greater trochanteric bursitis of right hip -     meloxicam (MOBIC) 15 MG tablet; Take 1 tablet (15 mg total) by mouth daily.  Colon cancer screening -     Cologuard  Encounter for screening mammogram for malignant neoplasm of breast -     MM 3D SCREEN BREAST BILATERAL  Elevated blood pressure reading   Needed mammogram and cologuard ordered today.  Fasting labs ordered today.   BP elevated today. Discussed low salt diet and regular exercise.   Refilled medications for allergies, chronic pain, migraines.   Follow up in 6 months.

## 2021-12-25 DIAGNOSIS — Z79899 Other long term (current) drug therapy: Secondary | ICD-10-CM | POA: Diagnosis not present

## 2021-12-25 DIAGNOSIS — Z131 Encounter for screening for diabetes mellitus: Secondary | ICD-10-CM | POA: Diagnosis not present

## 2021-12-25 DIAGNOSIS — Z1329 Encounter for screening for other suspected endocrine disorder: Secondary | ICD-10-CM | POA: Diagnosis not present

## 2021-12-25 DIAGNOSIS — Z13220 Encounter for screening for lipoid disorders: Secondary | ICD-10-CM | POA: Diagnosis not present

## 2021-12-26 LAB — COMPLETE METABOLIC PANEL WITHOUT GFR
AG Ratio: 1.7 (calc) (ref 1.0–2.5)
ALT: 17 U/L (ref 6–29)
AST: 21 U/L (ref 10–35)
Albumin: 4.3 g/dL (ref 3.6–5.1)
Alkaline phosphatase (APISO): 102 U/L (ref 37–153)
BUN: 13 mg/dL (ref 7–25)
CO2: 28 mmol/L (ref 20–32)
Calcium: 9.9 mg/dL (ref 8.6–10.4)
Chloride: 105 mmol/L (ref 98–110)
Creat: 0.55 mg/dL (ref 0.50–1.03)
Globulin: 2.6 g/dL (ref 1.9–3.7)
Glucose, Bld: 84 mg/dL (ref 65–99)
Potassium: 4.7 mmol/L (ref 3.5–5.3)
Sodium: 143 mmol/L (ref 135–146)
Total Bilirubin: 0.4 mg/dL (ref 0.2–1.2)
Total Protein: 6.9 g/dL (ref 6.1–8.1)
eGFR: 106 mL/min/{1.73_m2}

## 2021-12-26 LAB — CBC WITH DIFFERENTIAL/PLATELET
Absolute Monocytes: 525 cells/uL (ref 200–950)
Basophils Absolute: 37 cells/uL (ref 0–200)
Basophils Relative: 0.5 %
Eosinophils Absolute: 148 cells/uL (ref 15–500)
Eosinophils Relative: 2 %
HCT: 42.4 % (ref 35.0–45.0)
Hemoglobin: 14.4 g/dL (ref 11.7–15.5)
Lymphs Abs: 2738 cells/uL (ref 850–3900)
MCH: 29.3 pg (ref 27.0–33.0)
MCHC: 34 g/dL (ref 32.0–36.0)
MCV: 86.4 fL (ref 80.0–100.0)
MPV: 10.4 fL (ref 7.5–12.5)
Monocytes Relative: 7.1 %
Neutro Abs: 3952 cells/uL (ref 1500–7800)
Neutrophils Relative %: 53.4 %
Platelets: 287 10*3/uL (ref 140–400)
RBC: 4.91 10*6/uL (ref 3.80–5.10)
RDW: 12.7 % (ref 11.0–15.0)
Total Lymphocyte: 37 %
WBC: 7.4 10*3/uL (ref 3.8–10.8)

## 2021-12-26 LAB — LIPID PANEL W/REFLEX DIRECT LDL
Cholesterol: 208 mg/dL — ABNORMAL HIGH (ref <200)
HDL: 65 mg/dL (ref >=50)
LDL Cholesterol (Calc): 126 mg/dL (calc) — ABNORMAL HIGH
Non-HDL Cholesterol (Calc): 143 mg/dL (calc) — ABNORMAL HIGH (ref <130)
Total CHOL/HDL Ratio: 3.2 (calc) (ref <5.0)
Triglycerides: 80 mg/dL (ref <150)

## 2021-12-26 LAB — TSH: TSH: 7.81 mIU/L — ABNORMAL HIGH (ref 0.40–4.50)

## 2021-12-28 NOTE — Progress Notes (Signed)
Jodi Perry,   Normal hemoglobin.  Kidney, liver, glucose look great.  HDL, good cholesterol, looks amazing.  LDL, bad cholesterol, not to optimal goal of under 100 but close.  10 year CV risk low at 3.6 percent. Keep working on diet and exercise changes to keep cholesterol low.

## 2022-03-08 ENCOUNTER — Other Ambulatory Visit: Payer: Self-pay | Admitting: Physician Assistant

## 2022-03-08 DIAGNOSIS — H1013 Acute atopic conjunctivitis, bilateral: Secondary | ICD-10-CM

## 2022-03-08 DIAGNOSIS — J302 Other seasonal allergic rhinitis: Secondary | ICD-10-CM

## 2022-03-27 DIAGNOSIS — J208 Acute bronchitis due to other specified organisms: Secondary | ICD-10-CM | POA: Diagnosis not present

## 2022-03-27 DIAGNOSIS — J209 Acute bronchitis, unspecified: Secondary | ICD-10-CM | POA: Diagnosis not present

## 2022-05-24 ENCOUNTER — Ambulatory Visit: Payer: BC Managed Care – PPO | Admitting: Physician Assistant

## 2022-05-24 VITALS — BP 140/82 | HR 60 | Ht 62.25 in | Wt 177.0 lb

## 2022-05-24 DIAGNOSIS — Z6832 Body mass index (BMI) 32.0-32.9, adult: Secondary | ICD-10-CM

## 2022-05-24 DIAGNOSIS — G43009 Migraine without aura, not intractable, without status migrainosus: Secondary | ICD-10-CM | POA: Diagnosis not present

## 2022-05-24 DIAGNOSIS — M25551 Pain in right hip: Secondary | ICD-10-CM | POA: Diagnosis not present

## 2022-05-24 DIAGNOSIS — M545 Low back pain, unspecified: Secondary | ICD-10-CM

## 2022-05-24 DIAGNOSIS — E6609 Other obesity due to excess calories: Secondary | ICD-10-CM

## 2022-05-24 DIAGNOSIS — G8929 Other chronic pain: Secondary | ICD-10-CM

## 2022-05-24 MED ORDER — METHOCARBAMOL 500 MG PO TABS: 500.0000 mg | ORAL_TABLET | Freq: Three times a day (TID) | ORAL | 5 refills | Status: DC

## 2022-05-24 MED ORDER — WEGOVY 0.5 MG/0.5ML ~~LOC~~ SOAJ: 0.5000 mg | SUBCUTANEOUS | 2 refills | Status: DC

## 2022-05-24 MED ORDER — WEGOVY 0.25 MG/0.5ML ~~LOC~~ SOAJ: 0.2500 mg | SUBCUTANEOUS | 0 refills | Status: DC

## 2022-05-24 MED ORDER — SUMATRIPTAN SUCCINATE 100 MG PO TABS: ORAL_TABLET | ORAL | 5 refills | Status: DC

## 2022-05-24 NOTE — Patient Instructions (Signed)
Semaglutide Injection (Weight Management) ?What is this medication? ?SEMAGLUTIDE (SEM a GLOO tide) promotes weight loss. It may also be used to maintain weight loss. It works by decreasing appetite. Changes to diet and exercise are often combined with this medication. ?This medicine may be used for other purposes; ask your health care provider or pharmacist if you have questions. ?COMMON BRAND NAME(S): Wegovy ?What should I tell my care team before I take this medication? ?They need to know if you have any of these conditions: ?Endocrine tumors (MEN 2) or if someone in your family had these tumors ?Eye disease, vision problems ?Gallbladder disease ?History of depression or mental health disease ?History of pancreatitis ?Kidney disease ?Stomach or intestine problems ?Suicidal thoughts, plans, or attempt; a previous suicide attempt by you or a family member ?Thyroid cancer or if someone in your family had thyroid cancer ?An unusual or allergic reaction to semaglutide, other medications, foods, dyes, or preservatives ?Pregnant or trying to get pregnant ?Breast-feeding ?How should I use this medication? ?This medication is injected under the skin. You will be taught how to prepare and give it. Take it as directed on the prescription label. It is given once every week (every 7 days). Keep taking it unless your care team tells you to stop. ?It is important that you put your used needles and pens in a special sharps container. Do not put them in a trash can. If you do not have a sharps container, call your pharmacist or care team to get one. ?A special MedGuide will be given to you by the pharmacist with each prescription and refill. Be sure to read this information carefully each time. ?This medication comes with INSTRUCTIONS FOR USE. Ask your pharmacist for directions on how to use this medication. Read the information carefully. Talk to your pharmacist or care team if you have questions. ?Talk to your care team about  the use of this medication in children. While it may be prescribed for children as young as 12 years for selected conditions, precautions do apply. ?Overdosage: If you think you have taken too much of this medicine contact a poison control center or emergency room at once. ?NOTE: This medicine is only for you. Do not share this medicine with others. ?What if I miss a dose? ?If you miss a dose and the next scheduled dose is more than 2 days away, take the missed dose as soon as possible. If you miss a dose and the next scheduled dose is less than 2 days away, do not take the missed dose. Take the next dose at your regular time. Do not take double or extra doses. If you miss your dose for 2 weeks or more, take the next dose at your regular time or call your care team to talk about how to restart this medication. ?What may interact with this medication? ?Insulin and other medications for diabetes ?This list may not describe all possible interactions. Give your health care provider a list of all the medicines, herbs, non-prescription drugs, or dietary supplements you use. Also tell them if you smoke, drink alcohol, or use illegal drugs. Some items may interact with your medicine. ?What should I watch for while using this medication? ?Visit your care team for regular checks on your progress. It may be some time before you see the benefit from this medication. ?Drink plenty of fluids while taking this medication. Check with your care team if you have severe diarrhea, nausea, and vomiting, or if you sweat a   lot. The loss of too much body fluid may make it dangerous for you to take this medication. ?This medication may affect blood sugar levels. Ask your care team if changes in diet or medications are needed if you have diabetes. ?If you or your family notice any changes in your behavior, such as new or worsening depression, thoughts of harming yourself, anxiety, other unusual or disturbing thoughts, or memory loss, call  your care team right away. ?Women should inform their care team if they wish to become pregnant or think they might be pregnant. Losing weight while pregnant is not advised and may cause harm to the unborn child. Talk to your care team for more information. ?What side effects may I notice from receiving this medication? ?Side effects that you should report to your care team as soon as possible: ?Allergic reactions--skin rash, itching, hives, swelling of the face, lips, tongue, or throat ?Change in vision ?Dehydration--increased thirst, dry mouth, feeling faint or lightheaded, headache, dark yellow or brown urine ?Gallbladder problems--severe stomach pain, nausea, vomiting, fever ?Heart palpitations--rapid, pounding, or irregular heartbeat ?Kidney injury--decrease in the amount of urine, swelling of the ankles, hands, or feet ?Pancreatitis--severe stomach pain that spreads to your back or gets worse after eating or when touched, fever, nausea, vomiting ?Thoughts of suicide or self-harm, worsening mood, feelings of depression ?Thyroid cancer--new mass or lump in the neck, pain or trouble swallowing, trouble breathing, hoarseness ?Side effects that usually do not require medical attention (report to your care team if they continue or are bothersome): ?Diarrhea ?Loss of appetite ?Nausea ?Stomach pain ?Vomiting ?This list may not describe all possible side effects. Call your doctor for medical advice about side effects. You may report side effects to FDA at 1-800-FDA-1088. ?Where should I keep my medication? ?Keep out of the reach of children and pets. ?Refrigeration (preferred): Store in the refrigerator. Do not freeze. Keep this medication in the original container until you are ready to take it. Get rid of any unused medication after the expiration date. ?Room temperature: If needed, prior to cap removal, the pen can be stored at room temperature for up to 28 days. Protect from light. If it is stored at room  temperature, get rid of any unused medication after 28 days or after it expires, whichever is first. ?It is important to get rid of the medication as soon as you no longer need it or it is expired. You can do this in two ways: ?Take the medication to a medication take-back program. Check with your pharmacy or law enforcement to find a location. ?If you cannot return the medication, follow the directions in the MedGuide. ?NOTE: This sheet is a summary. It may not cover all possible information. If you have questions about this medicine, talk to your doctor, pharmacist, or health care provider. ?? 2023 Elsevier/Gold Standard (2021-12-16 00:00:00) ? ?

## 2022-05-24 NOTE — Progress Notes (Incomplete)
   Established Patient Office Visit  Subjective   Patient ID: Jodi Perry, female    DOB: 1961-11-12  Age: 61 y.o. MRN: [id number]  Chief Complaint  Patient presents with  . Follow-up    HPI  No personal or family hx   ROS    Objective:     BP 140/82   Pulse 60   Ht 5' 2.25" (1.581 m)   Wt 177 lb (80.3 kg)   SpO2 100%   BMI 32.11 kg/m  {Vitals History (Optional):23777}  Physical Exam   No results found for any visits on 05/24/22.  {Labs (Optional):23779}  The 10-year ASCVD risk score (Arnett DK, et al., 2019) is: 3.2%    Assessment & Plan:   Problem List Items Addressed This Visit       Unprioritized   Migraine without aura and without status migrainosus, not intractable   Relevant Medications   SUMAtriptan (IMITREX) 100 MG tablet   methocarbamol (ROBAXIN) 500 MG tablet   Acute right hip pain   Relevant Medications   methocarbamol (ROBAXIN) 500 MG tablet   Chronic bilateral low back pain without sciatica   Relevant Medications   methocarbamol (ROBAXIN) 500 MG tablet    No follow-ups on file.    Tandy Gaw, PA-C

## 2022-05-26 ENCOUNTER — Telehealth: Payer: Self-pay

## 2022-05-26 NOTE — Telephone Encounter (Addendum)
Initiated Prior authorization [id number] (teired strenghths) Via: Covermymeds Case/Key:[id number] Status: approved  as of 05/26/22 Reason: Notified Pt via: Mychart

## 2022-05-31 ENCOUNTER — Other Ambulatory Visit: Payer: Self-pay | Admitting: Physician Assistant

## 2022-05-31 NOTE — Telephone Encounter (Signed)
Called patient and she has tried other pharmacies with no luck. She will wait for it to come in.

## 2022-06-08 ENCOUNTER — Encounter: Payer: Self-pay | Admitting: Physician Assistant

## 2022-06-15 ENCOUNTER — Encounter: Payer: Self-pay | Admitting: Physician Assistant

## 2022-07-01 DIAGNOSIS — H1013 Acute atopic conjunctivitis, bilateral: Secondary | ICD-10-CM | POA: Diagnosis not present

## 2022-08-24 ENCOUNTER — Ambulatory Visit: Payer: BC Managed Care – PPO | Admitting: Physician Assistant

## 2022-09-01 DIAGNOSIS — M25841 Other specified joint disorders, right hand: Secondary | ICD-10-CM | POA: Diagnosis not present

## 2022-09-01 DIAGNOSIS — R2231 Localized swelling, mass and lump, right upper limb: Secondary | ICD-10-CM | POA: Diagnosis not present

## 2022-09-27 DIAGNOSIS — R0981 Nasal congestion: Secondary | ICD-10-CM | POA: Diagnosis not present

## 2022-09-27 DIAGNOSIS — B9689 Other specified bacterial agents as the cause of diseases classified elsewhere: Secondary | ICD-10-CM | POA: Diagnosis not present

## 2022-09-27 DIAGNOSIS — J019 Acute sinusitis, unspecified: Secondary | ICD-10-CM | POA: Diagnosis not present

## 2022-11-17 ENCOUNTER — Other Ambulatory Visit: Payer: Self-pay | Admitting: Physician Assistant

## 2022-11-17 DIAGNOSIS — J302 Other seasonal allergic rhinitis: Secondary | ICD-10-CM

## 2022-11-17 DIAGNOSIS — H1013 Acute atopic conjunctivitis, bilateral: Secondary | ICD-10-CM

## 2022-12-03 ENCOUNTER — Ambulatory Visit: Payer: BC Managed Care – PPO | Admitting: Physician Assistant

## 2022-12-08 ENCOUNTER — Ambulatory Visit: Payer: BC Managed Care – PPO | Admitting: Physician Assistant

## 2022-12-08 ENCOUNTER — Encounter: Payer: Self-pay | Admitting: Physician Assistant

## 2022-12-08 VITALS — BP 158/98 | HR 54 | Ht 62.25 in | Wt 174.0 lb

## 2022-12-08 DIAGNOSIS — G43009 Migraine without aura, not intractable, without status migrainosus: Secondary | ICD-10-CM

## 2022-12-08 DIAGNOSIS — H1013 Acute atopic conjunctivitis, bilateral: Secondary | ICD-10-CM

## 2022-12-08 DIAGNOSIS — M25551 Pain in right hip: Secondary | ICD-10-CM

## 2022-12-08 DIAGNOSIS — M7061 Trochanteric bursitis, right hip: Secondary | ICD-10-CM

## 2022-12-08 DIAGNOSIS — G8929 Other chronic pain: Secondary | ICD-10-CM

## 2022-12-08 DIAGNOSIS — Z1231 Encounter for screening mammogram for malignant neoplasm of breast: Secondary | ICD-10-CM

## 2022-12-08 DIAGNOSIS — M160 Bilateral primary osteoarthritis of hip: Secondary | ICD-10-CM

## 2022-12-08 DIAGNOSIS — J302 Other seasonal allergic rhinitis: Secondary | ICD-10-CM

## 2022-12-08 DIAGNOSIS — M545 Low back pain, unspecified: Secondary | ICD-10-CM

## 2022-12-08 DIAGNOSIS — M5136 Other intervertebral disc degeneration, lumbar region: Secondary | ICD-10-CM

## 2022-12-08 DIAGNOSIS — R030 Elevated blood-pressure reading, without diagnosis of hypertension: Secondary | ICD-10-CM

## 2022-12-08 DIAGNOSIS — Z6831 Body mass index (BMI) 31.0-31.9, adult: Secondary | ICD-10-CM

## 2022-12-08 DIAGNOSIS — E6609 Other obesity due to excess calories: Secondary | ICD-10-CM

## 2022-12-08 MED ORDER — SUMATRIPTAN SUCCINATE 100 MG PO TABS: ORAL_TABLET | ORAL | 5 refills | Status: DC

## 2022-12-08 MED ORDER — GABAPENTIN 300 MG PO CAPS: ORAL_CAPSULE | ORAL | 1 refills | Status: DC

## 2022-12-08 MED ORDER — FLUTICASONE PROPIONATE 50 MCG/ACT NA SUSP: NASAL | 3 refills | Status: DC

## 2022-12-08 MED ORDER — ZEPBOUND 5 MG/0.5ML ~~LOC~~ SOAJ: 5.0000 mg | SUBCUTANEOUS | 0 refills | Status: DC

## 2022-12-08 MED ORDER — MELOXICAM 15 MG PO TABS: 15.0000 mg | ORAL_TABLET | Freq: Every day | ORAL | 5 refills | Status: DC

## 2022-12-08 MED ORDER — ZEPBOUND 7.5 MG/0.5ML ~~LOC~~ SOAJ: 7.5000 mg | SUBCUTANEOUS | 0 refills | Status: DC

## 2022-12-08 MED ORDER — METHOCARBAMOL 500 MG PO TABS: 500.0000 mg | ORAL_TABLET | Freq: Three times a day (TID) | ORAL | 5 refills | Status: DC

## 2022-12-08 MED ORDER — ZEPBOUND 2.5 MG/0.5ML ~~LOC~~ SOAJ: 2.5000 mg | SUBCUTANEOUS | 0 refills | Status: DC

## 2022-12-08 MED ORDER — FEXOFENADINE HCL 180 MG PO TABS: 180.0000 mg | ORAL_TABLET | Freq: Every day | ORAL | 3 refills | Status: DC

## 2022-12-08 MED ORDER — GABAPENTIN 100 MG PO CAPS: ORAL_CAPSULE | ORAL | 3 refills | Status: DC

## 2022-12-08 NOTE — Progress Notes (Signed)
Established Patient Office Visit  Subjective   Patient ID: Jodi Perry, female    DOB: [birth year]  Age: 61 y.o. MRN: [id number]  Chief Complaint  Patient presents with   Follow-up    HPI Pt is a 61 yo obese female with chronic low back pain due to lumbar DDD, allergies, migraines who presents to the clinic for medication refills. She is not checking BP at home. She denies any CP, palpitations, headaches or dizziness. She would like to lose weight. Was not able to get saxenda or wegovy due to supply issues.   .. 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   Allergic conjunctivitis of both eyes 12/02/2017   Seasonal allergies 08/29/2018   Elevated blood pressure reading 08/29/2018   Acute right hip pain 01/14/2020   Chronic bilateral low back pain without sciatica 01/14/2020   Elevated LDL cholesterol level 01/22/2020   Greater trochanteric bursitis of right hip 08/29/2020   Hot flashes, menopausal 09/03/2020   Lumbar degenerative disc disease 09/03/2020   Primary osteoarthritis of both hips 09/03/2020   Resolved Ambulatory Problems    Diagnosis Date Noted   MIGRAINE HEADACHE 09/08/2010   Depression 10/29/2014   Abnormal weight gain 10/29/2014   Tetanus toxoid vaccination within the past 10 years 03/05/2015   BPPV (benign paroxysmal positional vertigo) 07/15/2015   Thumb pain 12/30/2015   No Additional Past Medical History      ROS See HPI.    Objective:     BP (!) 158/98   Pulse (!) 54   Ht 5' 2.25" (1.581 m)   Wt 174 lb (78.9 kg)   SpO2 98%   BMI 31.57 kg/m  BP Readings from Last 3 Encounters:  12/08/22 (!)  158/98  05/24/22 140/82  11/23/21 (!) 150/70   Wt Readings from Last 3 Encounters:  12/08/22 174 lb (78.9 kg)  05/24/22 177 lb (80.3 kg)  11/23/21 172 lb (78 kg)    ..    12/08/2022    9:14 AM 05/24/2022    2:20 PM 02/27/2021    8:31 AM 02/22/2020    9:57 AM 03/22/2017    5:36 PM  Depression screen PHQ 2/9  Decreased Interest 0 0 0 0 0  Down, Depressed, Hopeless 0 0 0 0 0  PHQ - 2 Score 0 0 0 0 0  Altered sleeping   1 2   Tired, decreased energy   0 2   Change in appetite   0 0   Feeling bad or failure about yourself    0 0   Trouble concentrating   1 1   Moving slowly or fidgety/restless   0 0   Suicidal thoughts   0 0   PHQ-9 Score   2 5   Difficult doing work/chores   Not difficult at all       Physical Exam Constitutional:      Appearance: Normal appearance. She is obese.  HENT:     Head: Normocephalic.  Neck:     Vascular: No carotid bruit.  Cardiovascular:     Rate and Rhythm: Normal rate and regular rhythm.     Pulses: Normal pulses.  Pulmonary:     Effort: Pulmonary effort is normal.  Musculoskeletal:     Right lower leg: No edema.     Left lower leg: No edema.  Neurological:     Mental Status: She is alert.  Psychiatric:        Mood and Affect: Mood normal.         Assessment & Plan:  Marland KitchenMarland KitchenShamyia was seen today for follow-up.  Diagnoses and all orders for this visit:  Lumbar degenerative disc disease -     gabapentin (NEURONTIN) 300 MG capsule; Take one tablet at bedtime.  Encounter for screening mammogram for malignant neoplasm of breast -     MM 3D SCREEN BREAST BILATERAL  Seasonal allergies -     fexofenadine (ALLEGRA) 180 MG tablet; Take 1 tablet (180 mg total) by mouth daily. -     fluticasone (FLONASE) 50 MCG/ACT nasal spray; SPRAY 2 SPRAYS INTO EACH NOSTRIL EVERY DAY  Allergic conjunctivitis of both eyes -     fexofenadine (ALLEGRA) 180 MG tablet; Take 1 tablet (180 mg total) by mouth daily. -     fluticasone (FLONASE) 50 MCG/ACT  nasal spray; SPRAY 2 SPRAYS INTO EACH NOSTRIL EVERY DAY  Chronic bilateral low back pain without sciatica -     Discontinue: gabapentin (NEURONTIN) 100 MG capsule; Take 1-3 tablets at bedtime for pain. -     meloxicam (MOBIC) 15 MG tablet; Take 1 tablet (15 mg total) by mouth daily. -     methocarbamol (ROBAXIN) 500 MG tablet; Take 1 tablet (500 mg total) by mouth 3 (three) times daily. -     gabapentin (NEURONTIN) 300 MG capsule; Take one tablet at bedtime.  Greater trochanteric bursitis of right hip -     meloxicam (MOBIC) 15 MG tablet; Take 1 tablet (15 mg total) by mouth daily.  Acute right hip pain -     methocarbamol (ROBAXIN) 500 MG tablet; Take 1 tablet (500 mg total) by mouth 3 (three) times daily.  Migraine without aura and without status migrainosus, not intractable -     SUMAtriptan (IMITREX) 100 MG tablet; TAKE 1 TABLET FOR MIGRAINE MAY REPEAT IN 2 HRS IF HEADACHE PERSISTS  Class 1 obesity due to excess calories without serious comorbidity with body mass index (BMI) of 31.0 to 31.9 in adult -     tirzepatide (ZEPBOUND) 2.5 MG/0.5ML Pen; Inject 2.5 mg into the skin once a week. -     tirzepatide (ZEPBOUND) 5 MG/0.5ML Pen; Inject 5 mg into the skin once a week. -     tirzepatide (ZEPBOUND) 7.5 MG/0.5ML Pen; Inject 7.5 mg into the skin once a week.  Primary osteoarthritis of both hips  Elevated blood pressure reading   BP elevated today and suggested medication  Pt would like to start checking at home and report readings and see if she needs to start medication   Allergies controlled with allegra and flonase  Refilled gabapentin/mobic/robaxin for chronic low back pain and bursitis  Imitrex for migraines as needed  .Marland KitchenDiscussed low carb diet with 1500 calories and 80g of protein.  Exercising at least 150 minutes a week.  [employer] could be a Chief Technology Officer.  Zepbound sent to pharmacy Discussed side effects and how to titrate up Follow up in 3 months   Tandy Gaw, PA-C

## 2022-12-10 ENCOUNTER — Telehealth: Payer: Self-pay

## 2022-12-10 NOTE — Telephone Encounter (Signed)
Initiated Prior authorization [id number] 2.5MG /0.5ML pen-injectors  Via: Covermymeds Case/Key:[id number] Status: Pending as of 12/10/22 Reason: Notified Pt via: Mychart

## 2022-12-14 ENCOUNTER — Encounter: Payer: Self-pay | Admitting: Physician Assistant

## 2022-12-15 MED ORDER — HYDROCHLOROTHIAZIDE 12.5 MG PO TABS: 12.5000 mg | ORAL_TABLET | Freq: Every day | ORAL | 0 refills | Status: DC

## 2022-12-15 NOTE — Addendum Note (Signed)
Addended by: Donella Stade on: 12/15/2022 01:08 PM   Modules accepted: Orders

## 2022-12-17 ENCOUNTER — Encounter: Payer: Self-pay | Admitting: Physician Assistant

## 2023-01-10 MED ORDER — HYDROCHLOROTHIAZIDE 25 MG PO TABS: 25.0000 mg | ORAL_TABLET | Freq: Every day | ORAL | 1 refills | Status: DC

## 2023-01-10 NOTE — Addendum Note (Signed)
Addended by: Donella Stade on: 01/10/2023 11:33 AM   Modules accepted: Orders

## 2023-01-28 ENCOUNTER — Ambulatory Visit: Payer: BC Managed Care – PPO | Admitting: Physician Assistant

## 2023-03-09 ENCOUNTER — Encounter: Payer: Self-pay | Admitting: Physician Assistant

## 2023-03-09 ENCOUNTER — Ambulatory Visit (INDEPENDENT_AMBULATORY_CARE_PROVIDER_SITE_OTHER): Payer: BC Managed Care – PPO

## 2023-03-09 ENCOUNTER — Ambulatory Visit (INDEPENDENT_AMBULATORY_CARE_PROVIDER_SITE_OTHER): Payer: BC Managed Care – PPO | Admitting: Physician Assistant

## 2023-03-09 VITALS — BP 137/80 | HR 60 | Ht 62.25 in | Wt 168.0 lb

## 2023-03-09 DIAGNOSIS — Z6830 Body mass index (BMI) 30.0-30.9, adult: Secondary | ICD-10-CM

## 2023-03-09 DIAGNOSIS — F439 Reaction to severe stress, unspecified: Secondary | ICD-10-CM

## 2023-03-09 DIAGNOSIS — Z780 Asymptomatic menopausal state: Secondary | ICD-10-CM

## 2023-03-09 DIAGNOSIS — Z13220 Encounter for screening for lipoid disorders: Secondary | ICD-10-CM | POA: Diagnosis not present

## 2023-03-09 DIAGNOSIS — Z1231 Encounter for screening mammogram for malignant neoplasm of breast: Secondary | ICD-10-CM | POA: Diagnosis not present

## 2023-03-09 DIAGNOSIS — Z1329 Encounter for screening for other suspected endocrine disorder: Secondary | ICD-10-CM | POA: Diagnosis not present

## 2023-03-09 DIAGNOSIS — Z131 Encounter for screening for diabetes mellitus: Secondary | ICD-10-CM

## 2023-03-09 DIAGNOSIS — I10 Essential (primary) hypertension: Secondary | ICD-10-CM

## 2023-03-09 DIAGNOSIS — F419 Anxiety disorder, unspecified: Secondary | ICD-10-CM

## 2023-03-09 DIAGNOSIS — Z79899 Other long term (current) drug therapy: Secondary | ICD-10-CM

## 2023-03-09 DIAGNOSIS — E6609 Other obesity due to excess calories: Secondary | ICD-10-CM

## 2023-03-09 MED ORDER — WEGOVY 0.25 MG/0.5ML ~~LOC~~ SOAJ: 0.2500 mg | SUBCUTANEOUS | 0 refills | Status: DC

## 2023-03-09 MED ORDER — HYDROCHLOROTHIAZIDE 12.5 MG PO CAPS: 12.5000 mg | ORAL_CAPSULE | Freq: Every day | ORAL | 1 refills | Status: DC

## 2023-03-09 NOTE — Progress Notes (Signed)
HOME BLOOD PRESSURE READINGS 01/12/2023 124/80 01/14/2023 139/86   132/77 01/17/2023 127/78 01/19/2023 127/87  01/22/2023 141/88 01/23/2023 131/85 01/24/2023 124/84 01/25/2023 140/80 01/26/2023 136/87    110/76 01/27/2023 135/82 01/28/2023 155/83 02/01/2023 124/81 02/03/2023 133/80 02/06/2023 138/87 02/08/2023 115/77 02/15/2023 130/86 02/16/2023 119/77   118/81 02/19/2023 122/84    121/84 02/22/2023 133/79   127/80 03/02/2023 132/83   127/80 03/03/2023 114/80 03/08/2023 132/86   141/84   130/82 03/08/2023 142/82   134/84 03/09/2023 147/91   139/88

## 2023-03-09 NOTE — Patient Instructions (Addendum)
Continue on HCTZ 12.5mg  daily.  Continue to work on weight loss will try wegovy at [facility] Let me know if able to get  For anxiety and stress Trial of lavendar tablets-calm aid Ashwaganda

## 2023-03-09 NOTE — Progress Notes (Signed)
Established Patient Office Visit  Subjective   Patient ID: Jodi Perry, female    DOB: 02/23/1961  Age: 62 y.o. MRN: [id number]  Chief Complaint  Patient presents with   Follow-up    HPI Pt is a 62 yo female who presents to the clinic to follow up on HTN and weight.   She was not able to get GLP-1 due to cost and insurance coverage. Per pt wegovy was approved but cannot find it. She has lost 6lbs on diet alone with exercise. She has cut salt out to 1500mg  a day. She has replaced with potassium salt.   Pt has been checking BP at home and most of her readings are 120s to 130s/70-80s. She does have a few BP in the 140s over 80s. When she increased HCTZ to 25mg  she got loose stools and noticed some rectal bleeding. She went back down to 12.5mg  and loose stools and rectal bleeding stopped. She has not returned her cologuard. She does not want colonoscopy.   .. 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   Allergic conjunctivitis of both eyes 12/02/2017   Seasonal allergies 08/29/2018   Elevated blood pressure reading 08/29/2018   Acute right hip pain 01/14/2020   Chronic bilateral low back pain without sciatica 01/14/2020   Elevated LDL cholesterol level 01/22/2020   Greater trochanteric bursitis of right hip 08/29/2020   Hot flashes, menopausal 09/03/2020   Lumbar degenerative disc disease 09/03/2020   Primary osteoarthritis of both hips 09/03/2020   Stress 03/09/2023   Post-menopausal 03/09/2023   Resolved Ambulatory Problems    Diagnosis Date Noted   MIGRAINE HEADACHE 09/08/2010   Depression 10/29/2014   Abnormal weight gain  10/29/2014   Tetanus toxoid vaccination within the past 10 years 03/05/2015   BPPV (benign paroxysmal positional vertigo) 07/15/2015   Thumb pain 12/30/2015   No Additional Past Medical History    ROS See HPI.    Objective:     BP 137/80   Pulse 60   Ht 5' 2.25" (1.581 m)   Wt 168 lb (76.2 kg)   LMP 07/23/2017   SpO2 98%   BMI 30.48 kg/m  BP Readings from Last 3 Encounters:  03/09/23 137/80  12/08/22 (!) 158/98  05/24/22 140/82   Wt Readings from Last 3 Encounters:  03/09/23 168 lb (76.2 kg)  12/08/22 174 lb (78.9 kg)  05/24/22 177 lb (80.3 kg)      Physical Exam Constitutional:      Appearance: Normal appearance. She is obese.  HENT:     Head: Normocephalic.  Cardiovascular:     Rate and Rhythm: Normal rate and regular rhythm.  Pulmonary:     Effort: Pulmonary effort is normal.     Breath sounds: Normal breath sounds.  Neurological:     General: No focal deficit present.     Mental Status: She is oriented to person, place, and time.  Psychiatric:        Mood and Affect: Mood normal.       The 10-year ASCVD risk score (Arnett DK, et al., 2019) is: 4.7%    Assessment & Plan:  Marland KitchenMarland KitchenBellatrix was seen today for  follow-up.  Diagnoses and all orders for this visit:  Thyroid disorder screen -     TSH  Medication management -     TSH -     Lipid Panel w/reflex Direct LDL -     COMPLETE METABOLIC PANEL WITH GFR -     CBC with Differential/Platelet  Screening for diabetes mellitus -     COMPLETE METABOLIC PANEL WITH GFR  Screening for lipid disorders -     Lipid Panel w/reflex Direct LDL  Anxiety  Stress  Post-menopausal -     VITAMIN D 25 Hydroxy (Vit-D Deficiency, Fractures)  Class 1 obesity due to excess calories without serious comorbidity with body mass index (BMI) of 30.0 to 30.9 in adult -     WEGOVY 0.25 MG/0.5ML SOAJ; Inject 0.25 mg into the skin once a week. Use this dose for 1 month (4 shots) and then increase to next higher  dose.  Primary hypertension -     hydrochlorothiazide (MICROZIDE) 12.5 MG capsule; Take 1 capsule (12.5 mg total) by mouth daily.   BP better in office Continue HCTZ 12.5mg   Continue to check BP regularly Continue to work on diet and exericse Start wegovy sent to [facility] Fasting labs ordered and to check potassium Discussed anxiety/stress Consider lavendar tablets or ashwaganda Follow up in 33months   Iran Planas, PA-C

## 2023-03-13 ENCOUNTER — Other Ambulatory Visit: Payer: Self-pay | Admitting: Physician Assistant

## 2023-03-14 ENCOUNTER — Telehealth: Payer: Self-pay

## 2023-03-14 NOTE — Progress Notes (Signed)
Normal mammogram. Follow up in one year.

## 2023-03-14 NOTE — Telephone Encounter (Signed)
Patient states she was told by her pharmacy that [facility] needing a PA.  I told her that we were behind on the PA's but that this will be worked on asap and could  possibly already be in process.

## 2023-03-15 ENCOUNTER — Telehealth: Payer: Self-pay

## 2023-03-15 NOTE — Telephone Encounter (Signed)
Initiated Prior authorization for: Wegovy 0.25MG /0.5ML auto-injectors Via: Covermymeds Case/Key:[id number] Status: Pending as of 03/15/23 Reason: Notified Pt via: Mychart

## 2023-04-25 ENCOUNTER — Encounter: Payer: Self-pay | Admitting: Physician Assistant

## 2023-04-26 MED ORDER — WEGOVY 1 MG/0.5ML ~~LOC~~ SOAJ: 1.0000 mg | SUBCUTANEOUS | 0 refills | Status: DC

## 2023-04-26 MED ORDER — WEGOVY 0.5 MG/0.5ML ~~LOC~~ SOAJ: 0.5000 mg | SUBCUTANEOUS | 0 refills | Status: DC

## 2023-05-19 ENCOUNTER — Other Ambulatory Visit: Payer: Self-pay | Admitting: Physician Assistant

## 2023-05-19 DIAGNOSIS — J302 Other seasonal allergic rhinitis: Secondary | ICD-10-CM

## 2023-05-19 DIAGNOSIS — H1013 Acute atopic conjunctivitis, bilateral: Secondary | ICD-10-CM

## 2023-05-30 ENCOUNTER — Encounter: Payer: Self-pay | Admitting: Physician Assistant

## 2023-06-10 ENCOUNTER — Ambulatory Visit (INDEPENDENT_AMBULATORY_CARE_PROVIDER_SITE_OTHER): Payer: BC Managed Care – PPO | Admitting: Physician Assistant

## 2023-06-10 ENCOUNTER — Encounter: Payer: Self-pay | Admitting: Physician Assistant

## 2023-06-10 VITALS — BP 128/72 | HR 53 | Ht 62.25 in | Wt 158.0 lb

## 2023-06-10 DIAGNOSIS — Z13220 Encounter for screening for lipoid disorders: Secondary | ICD-10-CM | POA: Diagnosis not present

## 2023-06-10 DIAGNOSIS — M5136 Other intervertebral disc degeneration, lumbar region: Secondary | ICD-10-CM

## 2023-06-10 DIAGNOSIS — I10 Essential (primary) hypertension: Secondary | ICD-10-CM

## 2023-06-10 DIAGNOSIS — Z8639 Personal history of other endocrine, nutritional and metabolic disease: Secondary | ICD-10-CM | POA: Insufficient documentation

## 2023-06-10 DIAGNOSIS — M545 Low back pain, unspecified: Secondary | ICD-10-CM

## 2023-06-10 DIAGNOSIS — Z131 Encounter for screening for diabetes mellitus: Secondary | ICD-10-CM | POA: Diagnosis not present

## 2023-06-10 DIAGNOSIS — Z79899 Other long term (current) drug therapy: Secondary | ICD-10-CM | POA: Diagnosis not present

## 2023-06-10 DIAGNOSIS — E663 Overweight: Secondary | ICD-10-CM | POA: Diagnosis not present

## 2023-06-10 DIAGNOSIS — G8929 Other chronic pain: Secondary | ICD-10-CM

## 2023-06-10 DIAGNOSIS — Z1329 Encounter for screening for other suspected endocrine disorder: Secondary | ICD-10-CM | POA: Diagnosis not present

## 2023-06-10 MED ORDER — HYDROCHLOROTHIAZIDE 12.5 MG PO CAPS: 12.5000 mg | ORAL_CAPSULE | Freq: Every day | ORAL | 1 refills | Status: DC

## 2023-06-10 MED ORDER — WEGOVY 1.7 MG/0.75ML ~~LOC~~ SOAJ: 1.7000 mg | SUBCUTANEOUS | 0 refills | Status: DC

## 2023-06-10 MED ORDER — GABAPENTIN 300 MG PO CAPS: ORAL_CAPSULE | ORAL | 1 refills | Status: DC

## 2023-06-10 NOTE — Progress Notes (Signed)
Established Patient Office Visit  Subjective   Patient ID: Jodi Perry, female    DOB: [birth year]  Age: 62 y.o. MRN: [id number]  Chief Complaint  Patient presents with   Hypertension   mood    HPI Pt is a 62 yo female with HTN who presents to the clinic to follow up on weight.   Pt is down 10lbs in the last 3 months. She is moving more and eating less. No side effects of wegovy.   She denies any CP, palpitations, headaches or vision changes. She is taking hydrochlorothiazide daily. She does check her BP from time to time at home and runs in the 120s over 70s.   .. 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   Allergic conjunctivitis of both eyes 12/02/2017   Seasonal allergies 08/29/2018   Elevated blood pressure reading 08/29/2018   Acute right hip pain 01/14/2020   Chronic bilateral low back pain without sciatica 01/14/2020   Elevated LDL cholesterol level 01/22/2020   Greater trochanteric bursitis of right hip 08/29/2020   Hot flashes, menopausal 09/03/2020   Lumbar degenerative disc disease 09/03/2020   Primary osteoarthritis of both hips 09/03/2020   Stress 03/09/2023   Post-menopausal 03/09/2023   Primary hypertension 06/10/2023   Overweight (BMI 25.0-29.9) 06/10/2023   History of obesity 06/10/2023   Resolved Ambulatory Problems    Diagnosis Date Noted   MIGRAINE HEADACHE 09/08/2010   Depression 10/29/2014   Abnormal weight gain 10/29/2014   Tetanus toxoid vaccination within the past 10 years 03/05/2015   BPPV (benign paroxysmal positional vertigo) 07/15/2015   Thumb pain 12/30/2015   No Additional Past Medical  History    Review of Systems  All other systems reviewed and are negative.     Objective:     BP 128/72   Pulse (!) 53   Ht 5' 2.25" (1.581 m)   Wt 158 lb (71.7 kg)   LMP 07/23/2017   SpO2 100%   BMI 28.67 kg/m  BP Readings from Last 3 Encounters:  06/10/23 128/72  03/09/23 137/80  12/08/22 (!) 158/98   Wt Readings from Last 3 Encounters:  06/10/23 158 lb (71.7 kg)  03/09/23 168 lb (76.2 kg)  12/08/22 174 lb (78.9 kg)    ..    06/10/2023    8:26 AM 03/09/2023    8:57 AM 12/08/2022    9:14 AM 05/24/2022    2:20 PM 02/27/2021    8:31 AM  Depression screen PHQ 2/9  Decreased Interest 0 0 0 0 0  Down, Depressed, Hopeless 0 0 0 0 0  PHQ - 2 Score 0 0 0 0 0  Altered sleeping 1    1  Tired, decreased energy 1    0  Change in appetite 0    0  Feeling bad or failure about yourself  0    0  Trouble concentrating 0    1  Moving slowly or fidgety/restless 0    0  Suicidal thoughts 0    0  PHQ-9 Score 2    2  Difficult doing work/chores Not difficult at all    Not difficult at all   .Marland Kitchen  06/10/2023    8:26 AM 02/27/2021    8:31 AM 02/22/2020    9:57 AM  GAD 7 : Generalized Anxiety Score  Nervous, Anxious, on Edge 0 0 0  Control/stop worrying 0 0 0  Worry too much - different things 0 0 0  Trouble relaxing 0 0 0  Restless 0 0 0  Easily annoyed or irritable 0 1 1  Afraid - awful might happen 0 0 0  Total GAD 7 Score 0 1 1  Anxiety Difficulty Not difficult at all Not difficult at all Not difficult at all      Physical Exam Constitutional:      Appearance: Normal appearance.  HENT:     Head: Normocephalic.  Cardiovascular:     Rate and Rhythm: Normal rate and regular rhythm.  Pulmonary:     Effort: Pulmonary effort is normal.     Breath sounds: Normal breath sounds.  Musculoskeletal:     Right lower leg: No edema.     Left lower leg: No edema.  Neurological:     General: No focal deficit present.     Mental Status: She is alert and oriented to person,  place, and time.  Psychiatric:        Mood and Affect: Mood normal.       The 10-year ASCVD risk score (Arnett DK, et al., 2019) is: 4.1%    Assessment & Plan:  Marland KitchenMarland KitchenMaebel was seen today for hypertension and mood.  Diagnoses and all orders for this visit:  Primary hypertension -     WEGOVY 1.7 MG/0.75ML SOAJ; Inject 1.7 mg into the skin once a week. Use this dose for 1 month (4 shots) and then increase to next higher dose. -     hydrochlorothiazide (MICROZIDE) 12.5 MG capsule; Take 1 capsule (12.5 mg total) by mouth daily.  Lumbar degenerative disc disease -     gabapentin (NEURONTIN) 300 MG capsule; Take one tablet at bedtime. -     WEGOVY 1.7 MG/0.75ML SOAJ; Inject 1.7 mg into the skin once a week. Use this dose for 1 month (4 shots) and then increase to next higher dose.  Chronic bilateral low back pain without sciatica -     gabapentin (NEURONTIN) 300 MG capsule; Take one tablet at bedtime. -     WEGOVY 1.7 MG/0.75ML SOAJ; Inject 1.7 mg into the skin once a week. Use this dose for 1 month (4 shots) and then increase to next higher dose.  Overweight (BMI 25.0-29.9) -     WEGOVY 1.7 MG/0.75ML SOAJ; Inject 1.7 mg into the skin once a week. Use this dose for 1 month (4 shots) and then increase to next higher dose.  History of obesity -     WEGOVY 1.7 MG/0.75ML SOAJ; Inject 1.7 mg into the skin once a week. Use this dose for 1 month (4 shots) and then increase to next higher dose.   Pt is doing well on wegovy Increased to 1.7mg  weekly Continue diet and exercise Follow up in 3 months  Refilled gabapentin for pain.  2nd BP looks great Continue HcTZ for BP.   PHQ/GAD no concerns.  Mood great today.   Pt is getting labs today!      Return in about 3 months (around 09/10/2023) for weight.    Jodi Gaw, PA-C

## 2023-06-11 LAB — CBC WITH DIFFERENTIAL/PLATELET
Absolute Monocytes: 547 cells/uL (ref 200–950)
Basophils Absolute: 43 cells/uL (ref 0–200)
Basophils Relative: 0.6 %
Eosinophils Absolute: 223 cells/uL (ref 15–500)
Eosinophils Relative: 3.1 %
HCT: 42.6 % (ref 35.0–45.0)
Hemoglobin: 14 g/dL (ref 11.7–15.5)
Lymphs Abs: 2614 cells/uL (ref 850–3900)
MCH: 28.7 pg (ref 27.0–33.0)
MCHC: 32.9 g/dL (ref 32.0–36.0)
MCV: 87.5 fL (ref 80.0–100.0)
MPV: 10.7 fL (ref 7.5–12.5)
Monocytes Relative: 7.6 %
Neutro Abs: 3773 cells/uL (ref 1500–7800)
Neutrophils Relative %: 52.4 %
Platelets: 252 10*3/uL (ref 140–400)
RBC: 4.87 10*6/uL (ref 3.80–5.10)
RDW: 12.9 % (ref 11.0–15.0)
Total Lymphocyte: 36.3 %
WBC: 7.2 10*3/uL (ref 3.8–10.8)

## 2023-06-11 LAB — COMPLETE METABOLIC PANEL WITH GFR
AG Ratio: 1.5 (calc) (ref 1.0–2.5)
ALT: 12 U/L (ref 6–29)
AST: 19 U/L (ref 10–35)
Albumin: 4.2 g/dL (ref 3.6–5.1)
Alkaline phosphatase (APISO): 93 U/L (ref 37–153)
BUN: 10 mg/dL (ref 7–25)
CO2: 30 mmol/L (ref 20–32)
Calcium: 9.6 mg/dL (ref 8.6–10.4)
Chloride: 102 mmol/L (ref 98–110)
Creat: 0.54 mg/dL (ref 0.50–1.05)
Globulin: 2.8 g/dL (calc) (ref 1.9–3.7)
Glucose, Bld: 79 mg/dL (ref 65–99)
Potassium: 4.2 mmol/L (ref 3.5–5.3)
Sodium: 140 mmol/L (ref 135–146)
Total Bilirubin: 0.4 mg/dL (ref 0.2–1.2)
Total Protein: 7 g/dL (ref 6.1–8.1)
eGFR: 105 mL/min/{1.73_m2} (ref >=60)

## 2023-06-11 LAB — LIPID PANEL W/REFLEX DIRECT LDL
Cholesterol: 192 mg/dL (ref <200)
HDL: 53 mg/dL (ref >=50)
LDL Cholesterol (Calc): 119 mg/dL (calc) — ABNORMAL HIGH
Non-HDL Cholesterol (Calc): 139 mg/dL (calc) — ABNORMAL HIGH (ref <130)
Total CHOL/HDL Ratio: 3.6 (calc) (ref <5.0)
Triglycerides: 101 mg/dL (ref <150)

## 2023-06-11 LAB — VITAMIN D 25 HYDROXY (VIT D DEFICIENCY, FRACTURES): Vit D, 25-Hydroxy: 47 ng/mL (ref 30–100)

## 2023-06-11 LAB — TSH: TSH: 9.23 mIU/L — ABNORMAL HIGH (ref 0.40–4.50)

## 2023-06-13 NOTE — Progress Notes (Signed)
[place],   Vitamin D looks great.  Kidney, liver, glucose looks great.  Normal hemoglobin and WBC.  Cholesterol has improved. Risk is still below 7.5 percent. Continue with diet and exercise. Recheck in 1 year.   Marland Kitchen.The 10-year ASCVD risk score (Arnett DK, et al., 2019) is: 4.4%   Values used to calculate the score:     Age: 62 years     Sex: Female     Is Non-Hispanic African American: No     Diabetic: No     Tobacco smoker: No     Systolic Blood Pressure: 128 mmHg     Is BP treated: Yes     HDL Cholesterol: 53 mg/dL     Total Cholesterol: 192 mg/dL

## 2023-07-04 ENCOUNTER — Other Ambulatory Visit: Payer: Self-pay | Admitting: Physician Assistant

## 2023-07-04 DIAGNOSIS — M7061 Trochanteric bursitis, right hip: Secondary | ICD-10-CM

## 2023-07-04 DIAGNOSIS — G8929 Other chronic pain: Secondary | ICD-10-CM

## 2023-08-02 ENCOUNTER — Other Ambulatory Visit: Payer: Self-pay | Admitting: Physician Assistant

## 2023-08-02 DIAGNOSIS — G8929 Other chronic pain: Secondary | ICD-10-CM

## 2023-08-02 DIAGNOSIS — M7061 Trochanteric bursitis, right hip: Secondary | ICD-10-CM

## 2023-09-12 ENCOUNTER — Telehealth: Payer: Self-pay

## 2023-09-12 ENCOUNTER — Encounter: Payer: Self-pay | Admitting: Physician Assistant

## 2023-09-12 ENCOUNTER — Ambulatory Visit: Payer: BC Managed Care – PPO | Admitting: Physician Assistant

## 2023-09-12 VITALS — BP 122/78 | HR 89 | Ht 62.25 in | Wt 144.0 lb

## 2023-09-12 DIAGNOSIS — F419 Anxiety disorder, unspecified: Secondary | ICD-10-CM

## 2023-09-12 DIAGNOSIS — M51369 Other intervertebral disc degeneration, lumbar region without mention of lumbar back pain or lower extremity pain: Secondary | ICD-10-CM

## 2023-09-12 DIAGNOSIS — E663 Overweight: Secondary | ICD-10-CM | POA: Diagnosis not present

## 2023-09-12 DIAGNOSIS — M545 Low back pain, unspecified: Secondary | ICD-10-CM

## 2023-09-12 DIAGNOSIS — F439 Reaction to severe stress, unspecified: Secondary | ICD-10-CM

## 2023-09-12 DIAGNOSIS — M5136 Other intervertebral disc degeneration, lumbar region: Secondary | ICD-10-CM

## 2023-09-12 DIAGNOSIS — Z8639 Personal history of other endocrine, nutritional and metabolic disease: Secondary | ICD-10-CM | POA: Diagnosis not present

## 2023-09-12 DIAGNOSIS — K5903 Drug induced constipation: Secondary | ICD-10-CM | POA: Insufficient documentation

## 2023-09-12 DIAGNOSIS — I10 Essential (primary) hypertension: Secondary | ICD-10-CM

## 2023-09-12 DIAGNOSIS — G8929 Other chronic pain: Secondary | ICD-10-CM

## 2023-09-12 MED ORDER — WEGOVY 2.4 MG/0.75ML ~~LOC~~ SOAJ: 2.4000 mg | SUBCUTANEOUS | 1 refills | Status: DC

## 2023-09-12 NOTE — Telephone Encounter (Signed)
These are patients home BP 05/03/23 125/86 05/26/23 121/78,115/80 06/20/23 118/77,114/80 07/11/23 124/83, 07/15/43 130/83,117/83 08/25/23 115/77.

## 2023-09-12 NOTE — Progress Notes (Signed)
Established Patient Office Visit  Subjective   Patient ID: Jodi Perry, female    DOB: [birth year]  Age: 62 y.o. MRN: [id number]  No chief complaint on file.   HPI Pt is a 62 yo female with HTN and chronic low back pain due to DDD who presents to the clinic for medication refills.   She has lost 14lbs on wegovy with healthy diet and exercise. She gets 15,000 steps a day and does yoga for exercise. She has some constipation with wegovy but no other problems. She has noticed her low back pain has improved with weight loss.   She checks BP at home and have been great. 120s over 70s at home. Denies any CP, palpitations, headaches or vision changes.   Her anxiety and stress are better with natural supplement.   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   Allergic conjunctivitis of both eyes 12/02/2017   Seasonal allergies 08/29/2018   Elevated blood pressure reading 08/29/2018   Acute right hip pain 01/14/2020   Chronic bilateral low back pain without sciatica 01/14/2020   Elevated LDL cholesterol level 01/22/2020   Greater trochanteric bursitis of right hip 08/29/2020   Hot flashes, menopausal 09/03/2020   Lumbar degenerative disc disease 09/03/2020   Primary osteoarthritis of both hips 09/03/2020   Stress 03/09/2023   Post-menopausal 03/09/2023   Primary hypertension 06/10/2023   Overweight (BMI 25.0-29.9) 06/10/2023   History of obesity 06/10/2023   Drug-induced constipation 09/12/2023   Resolved Ambulatory Problems    Diagnosis Date Noted   MIGRAINE HEADACHE 09/08/2010   Depression 10/29/2014   Abnormal weight gain 10/29/2014    Tetanus toxoid vaccination within the past 10 years 03/05/2015   BPPV (benign paroxysmal positional vertigo) 07/15/2015   Thumb pain 12/30/2015   No Additional Past Medical History     ROS See HPI.    Objective:     BP 122/78   Pulse 89   Ht 5' 2.25" (1.581 m)   Wt 144 lb (65.3 kg)   LMP 07/23/2017   SpO2 99%   BMI 26.13 kg/m  BP Readings from Last 3 Encounters:  09/12/23 122/78  06/10/23 128/72  03/09/23 137/80   Wt Readings from Last 3 Encounters:  09/12/23 144 lb (65.3 kg)  06/10/23 158 lb (71.7 kg)  03/09/23 168 lb (76.2 kg)    .Marland Kitchen    09/12/2023    8:42 AM 06/10/2023    8:26 AM 02/27/2021    8:31 AM 02/22/2020    9:57 AM  GAD 7 : Generalized Anxiety Score  Nervous, Anxious, on Edge 1 0 0 0  Control/stop worrying 1 0 0 0  Worry too much - different things 1 0 0 0  Trouble relaxing 1 0 0 0  Restless 1 0 0 0  Easily annoyed or irritable 1 0 1 1  Afraid - awful might happen 1 0 0 0  Total GAD 7 Score 7 0 1 1  Anxiety Difficulty Somewhat difficult Not difficult at all Not difficult at all Not difficult at all      Physical Exam Constitutional:      Appearance: Normal appearance.  HENT:  Head: Normocephalic.  Cardiovascular:     Rate and Rhythm: Normal rate and regular rhythm.  Pulmonary:     Effort: Pulmonary effort is normal.     Breath sounds: Normal breath sounds.  Neurological:     General: No focal deficit present.     Mental Status: She is alert and oriented to person, place, and time.  Psychiatric:        Mood and Affect: Mood normal.        The 10-year ASCVD risk score (Arnett DK, et al., 2019) is: 4.4%    Assessment & Plan:  Marland KitchenMarland KitchenDiagnoses and all orders for this visit:  Overweight (BMI 25.0-29.9) -     WEGOVY 2.4 MG/0.75ML SOAJ; Inject 2.4 mg into the skin once a week.  History of obesity -     WEGOVY 2.4 MG/0.75ML SOAJ; Inject 2.4 mg into the skin once a week.  Chronic bilateral low back pain without sciatica -     WEGOVY  2.4 MG/0.75ML SOAJ; Inject 2.4 mg into the skin once a week.  Lumbar degenerative disc disease -     WEGOVY 2.4 MG/0.75ML SOAJ; Inject 2.4 mg into the skin once a week.  Primary hypertension -     WEGOVY 2.4 MG/0.75ML SOAJ; Inject 2.4 mg into the skin once a week.  Anxiety  Stress  Drug-induced constipation   Doing well and lost 14lbs Discussed way to manage constipation with hydration and miralax Increased wegovy to 2.4mg  weekly Continue healthy diet and exercise Follow up in 6 months  BP looks great!  Pt doing better with anxiety and stress with natural supplements  Follow up in 6 months   Tandy Gaw, PA-C

## 2023-10-11 ENCOUNTER — Other Ambulatory Visit: Payer: Self-pay | Admitting: Physician Assistant

## 2023-10-11 DIAGNOSIS — M51369 Other intervertebral disc degeneration, lumbar region without mention of lumbar back pain or lower extremity pain: Secondary | ICD-10-CM

## 2023-10-11 DIAGNOSIS — G8929 Other chronic pain: Secondary | ICD-10-CM

## 2023-11-05 DIAGNOSIS — Z79899 Other long term (current) drug therapy: Secondary | ICD-10-CM | POA: Diagnosis not present

## 2023-11-05 DIAGNOSIS — R1032 Left lower quadrant pain: Secondary | ICD-10-CM | POA: Diagnosis not present

## 2023-11-05 DIAGNOSIS — K5909 Other constipation: Secondary | ICD-10-CM | POA: Diagnosis not present

## 2023-11-05 DIAGNOSIS — K529 Noninfective gastroenteritis and colitis, unspecified: Secondary | ICD-10-CM | POA: Diagnosis not present

## 2023-11-05 DIAGNOSIS — K820 Obstruction of gallbladder: Secondary | ICD-10-CM | POA: Diagnosis not present

## 2023-11-05 DIAGNOSIS — I10 Essential (primary) hypertension: Secondary | ICD-10-CM | POA: Diagnosis not present

## 2023-11-05 DIAGNOSIS — N289 Disorder of kidney and ureter, unspecified: Secondary | ICD-10-CM | POA: Diagnosis not present

## 2023-11-05 DIAGNOSIS — K5730 Diverticulosis of large intestine without perforation or abscess without bleeding: Secondary | ICD-10-CM | POA: Diagnosis not present

## 2023-11-05 DIAGNOSIS — K6389 Other specified diseases of intestine: Secondary | ICD-10-CM | POA: Diagnosis not present

## 2023-11-05 DIAGNOSIS — K921 Melena: Secondary | ICD-10-CM | POA: Diagnosis not present

## 2023-11-08 ENCOUNTER — Encounter: Payer: Self-pay | Admitting: Physician Assistant

## 2023-11-14 ENCOUNTER — Encounter: Payer: Self-pay | Admitting: Physician Assistant

## 2023-11-14 ENCOUNTER — Ambulatory Visit: Payer: BC Managed Care – PPO | Admitting: Physician Assistant

## 2023-11-14 VITALS — BP 122/70 | HR 71 | Resp 16 | Ht 61.0 in | Wt 137.2 lb

## 2023-11-14 DIAGNOSIS — K529 Noninfective gastroenteritis and colitis, unspecified: Secondary | ICD-10-CM

## 2023-11-14 DIAGNOSIS — Z1211 Encounter for screening for malignant neoplasm of colon: Secondary | ICD-10-CM

## 2023-11-14 NOTE — Patient Instructions (Signed)
Colitis  Colitis is a condition in which the colon is inflamed. It can cause diarrhea, blood in the stool, and abdominal pain. Colitis can last a short time (be acute), or it may last a long time (become chronic). What are the causes? This condition may be caused by: Infections from viruses or bacteria. A reaction to medicine. Certain autoimmune diseases, such as Crohn's disease or ulcerative colitis. Radiation treatment. Decreased blood flow to the bowel (ischemia). What are the signs or symptoms? Symptoms of this condition include: Diarrhea, blood in the stool, or black, tarry stool. Pain in the joints or abdominal pain. Fever or fatigue. Vomiting. Weight loss. Bloating. Having fewer bowel movements than usual. A strong and sudden urge to have a bowel movement. Feeling like the bowel is not empty after a bowel movement. How is this diagnosed? This condition may be diagnosed based on a stool test and a blood test. You may also have other tests, such as: X-rays. CT scan. Colonoscopy. Endoscopy. Biopsy. How is this treated? Treatment for this condition depends on the cause. This condition may be treated with: Steps to rest the bowel, such as not eating or drinking for a period of time. Fluids that are given through an IV. Medicine for pain and diarrhea. Antibiotic medicines. Cortisone medicines. Surgery. Follow these instructions at home: Eating and drinking  Follow instructions from your health care provider about eating or drinking restrictions. Drink enough fluid to keep your urine pale yellow. Work with a dietitian to determine whether certain foods cause your condition to flare up. Avoid foods or drinks that cause flare-ups. Eat a well-balanced diet. General instructions If you were prescribed an antibiotic medicine, take it as told by your health care provider. Do not stop taking the antibiotic even if you start to feel better. Take over-the-counter and prescription  medicines only as told by your health care provider. Keep all follow-up visits. This is important. Contact a health care provider if: Your symptoms do not go away. You develop new symptoms. Get help right away if: You have a fever that does not go away with treatment. You develop chills. You have extreme weakness, fainting, or dehydration. You vomit repeatedly. You develop severe pain in your abdomen. You pass bloody or tarry stool. Summary Colitis is a condition in which the colon is inflamed. Colitis can last a short time (be acute), or it may last a long time (become chronic). Treatment for this condition depends on the cause and may include resting the bowel, taking medicines, or having surgery. If you were prescribed an antibiotic medicine, take it as told by your health care provider. Do not stop taking the antibiotic even if you start to feel better. Get help right away if you develop severe pain in your abdomen. Keep all follow-up visits. This is important. This information is not intended to replace advice given to you by your health care provider. Make sure you discuss any questions you have with your health care provider. Document Revised: 08/05/2020 Document Reviewed: 08/05/2020 Elsevier Patient Education  2024 [employer].

## 2023-11-14 NOTE — Progress Notes (Signed)
Established Patient Office Visit  Subjective   Patient ID: Jodi Perry, female    DOB: 07/21/1961  Age: 62 y.o. MRN: [id number]  Chief Complaint  Patient presents with   Hospitalization Follow-up    Pt is fup on hospital vist 11/05/23, pt was seen for Colitis ,Patient reports she had sharp and cramping pain in the mid lower abdomen. She was trying to use the BR and felt sick like she was going to pass out on Thursday night. She had large loose stool. Patient states she has had intermittent sharp pains with some diarrhea. She states there was small amount of blood in her stool. The pain is more on the left side now. She was seen at [facility] and sent here for further evaluation    HPI 62 yo female present today for hospital f/u. She visited ED on 11/23 for abdominal pain and diarrhea and was diagnosed with colitis via CT of abdomen and put on cipro and dicyclomine. WBC was mildly elevated. She had residual symptoms but that has since resolved. Cipro was completed 1 wk ago. She denies constipation, diarrhea, hematochezia, and tarry stools. She had a Cologuard sent to her in the past but she never completed it. She has heard too many bad things about colonoscopy and does not want one.   .. 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   Allergic conjunctivitis of both eyes 12/02/2017   Seasonal allergies 08/29/2018   Elevated blood pressure reading 08/29/2018   Acute right hip pain 01/14/2020   Chronic bilateral low back pain without sciatica 01/14/2020   Elevated LDL cholesterol level 01/22/2020   Greater trochanteric bursitis of right hip 08/29/2020    Hot flashes, menopausal 09/03/2020   Lumbar degenerative disc disease 09/03/2020   Primary osteoarthritis of both hips 09/03/2020   Stress 03/09/2023   Post-menopausal 03/09/2023   Primary hypertension 06/10/2023   Overweight (BMI 25.0-29.9) 06/10/2023   History of obesity 06/10/2023   Drug-induced constipation 09/12/2023   Resolved Ambulatory Problems    Diagnosis Date Noted   Migraine headache 09/08/2010   Depression 10/29/2014   Abnormal weight gain 10/29/2014   Tetanus toxoid vaccination within the past 10 years 03/05/2015   BPPV (benign paroxysmal positional vertigo) 07/15/2015   Thumb pain 12/30/2015   No Additional Past Medical History     Review of Systems  Gastrointestinal:  Negative for abdominal pain, blood in stool, constipation, diarrhea and melena.  Genitourinary:  Negative for dysuria, frequency and urgency.      Objective:     BP 122/70   Pulse 71   Resp 16   Ht 5\' 1"  (1.549 m)   Wt 137 lb 3.2 oz (62.2 kg)   LMP 07/23/2017   SpO2 99%   BMI 25.92 kg/m  BP Readings from Last 3 Encounters:  11/14/23 122/70  09/12/23 122/78  06/10/23 128/72      Physical Exam Constitutional:      Appearance: Normal appearance.  HENT:     Head: Normocephalic.  Cardiovascular:     Rate and Rhythm: Normal rate and regular rhythm.     Pulses: Normal pulses.     Heart sounds: Normal  heart sounds.  Pulmonary:     Effort: Pulmonary effort is normal.     Breath sounds: Normal breath sounds.  Abdominal:     General: There is no distension.     Tenderness: There is no abdominal tenderness. There is no guarding or rebound.  Neurological:     General: No focal deficit present.     Mental Status: She is alert and oriented to person, place, and time.  Psychiatric:        Mood and Affect: Mood normal.        The 10-year ASCVD risk score (Arnett DK, et al., 2019) is: 4.4%    Assessment & Plan:  Marland KitchenMarland KitchenKerriana was seen today for hospitalization  follow-up.  Diagnoses and all orders for this visit:  Colitis -     CBC w/Diff/Platelet -     Cologuard  Colon cancer screening -     Cologuard    Recommended colonoscopy to patient. Patient expressed concerns about colonoscopy complications. Patient wanted Cologuard.  Discussed risk vs benefits.  Ordered Cologuard. Recommended probiotics and avoidance of processed foods to prevent colitis symptoms. Advised patient to call the office if she experiences constipation or diarrhea. Will recheck WBC today.   Return if symptoms worsen or fail to improve.    Tandy Gaw, PA-C

## 2023-11-15 LAB — CBC WITH DIFFERENTIAL/PLATELET
Basophils Absolute: 0.1 10*3/uL (ref 0.0–0.2)
Basos: 1 %
EOS (ABSOLUTE): 0.2 10*3/uL (ref 0.0–0.4)
Eos: 2 %
Hematocrit: 39.5 % (ref 34.0–46.6)
Hemoglobin: 13.3 g/dL (ref 11.1–15.9)
Immature Grans (Abs): 0 10*3/uL (ref 0.0–0.1)
Immature Granulocytes: 0 %
Lymphocytes Absolute: 3.1 10*3/uL (ref 0.7–3.1)
Lymphs: 37 %
MCH: 29.6 pg (ref 26.6–33.0)
MCHC: 33.7 g/dL (ref 31.5–35.7)
MCV: 88 fL (ref 79–97)
Monocytes Absolute: 0.7 10*3/uL (ref 0.1–0.9)
Monocytes: 8 %
Neutrophils Absolute: 4.4 10*3/uL (ref 1.4–7.0)
Neutrophils: 52 %
Platelets: 326 10*3/uL (ref 150–450)
RBC: 4.5 x10E6/uL (ref 3.77–5.28)
RDW: 12.5 % (ref 11.7–15.4)
WBC: 8.6 10*3/uL (ref 3.4–10.8)

## 2023-11-15 NOTE — Progress Notes (Signed)
WBC in normal range.

## 2023-11-20 ENCOUNTER — Other Ambulatory Visit: Payer: Self-pay | Admitting: Physician Assistant

## 2023-11-20 DIAGNOSIS — I10 Essential (primary) hypertension: Secondary | ICD-10-CM

## 2023-11-20 DIAGNOSIS — H1013 Acute atopic conjunctivitis, bilateral: Secondary | ICD-10-CM

## 2023-11-20 DIAGNOSIS — J302 Other seasonal allergic rhinitis: Secondary | ICD-10-CM

## 2023-12-18 DIAGNOSIS — Z1211 Encounter for screening for malignant neoplasm of colon: Secondary | ICD-10-CM | POA: Diagnosis not present

## 2023-12-27 LAB — COLOGUARD: COLOGUARD: NEGATIVE

## 2024-01-08 ENCOUNTER — Other Ambulatory Visit: Payer: Self-pay | Admitting: Physician Assistant

## 2024-01-08 DIAGNOSIS — M25551 Pain in right hip: Secondary | ICD-10-CM

## 2024-01-08 DIAGNOSIS — J302 Other seasonal allergic rhinitis: Secondary | ICD-10-CM

## 2024-01-08 DIAGNOSIS — G8929 Other chronic pain: Secondary | ICD-10-CM

## 2024-01-08 DIAGNOSIS — H1013 Acute atopic conjunctivitis, bilateral: Secondary | ICD-10-CM

## 2024-03-05 ENCOUNTER — Ambulatory Visit: Admitting: Physician Assistant

## 2024-03-05 ENCOUNTER — Encounter: Payer: Self-pay | Admitting: Physician Assistant

## 2024-03-05 VITALS — BP 133/77 | HR 68 | Ht 61.0 in | Wt 147.0 lb

## 2024-03-05 DIAGNOSIS — G8929 Other chronic pain: Secondary | ICD-10-CM

## 2024-03-05 DIAGNOSIS — M51369 Other intervertebral disc degeneration, lumbar region without mention of lumbar back pain or lower extremity pain: Secondary | ICD-10-CM

## 2024-03-05 DIAGNOSIS — M545 Low back pain, unspecified: Secondary | ICD-10-CM

## 2024-03-05 DIAGNOSIS — R143 Flatulence: Secondary | ICD-10-CM | POA: Diagnosis not present

## 2024-03-05 DIAGNOSIS — R635 Abnormal weight gain: Secondary | ICD-10-CM | POA: Insufficient documentation

## 2024-03-05 DIAGNOSIS — E663 Overweight: Secondary | ICD-10-CM

## 2024-03-05 DIAGNOSIS — Z8639 Personal history of other endocrine, nutritional and metabolic disease: Secondary | ICD-10-CM

## 2024-03-05 MED ORDER — WEGOVY 0.5 MG/0.5ML ~~LOC~~ SOAJ: 0.5000 mg | SUBCUTANEOUS | 0 refills | Status: DC

## 2024-03-05 MED ORDER — WEGOVY 1 MG/0.5ML ~~LOC~~ SOAJ: 1.0000 mg | SUBCUTANEOUS | 0 refills | Status: DC

## 2024-03-05 MED ORDER — GABAPENTIN 300 MG PO CAPS: ORAL_CAPSULE | ORAL | 1 refills | Status: DC

## 2024-03-05 MED ORDER — WEGOVY 0.25 MG/0.5ML ~~LOC~~ SOAJ: 0.2500 mg | SUBCUTANEOUS | 0 refills | Status: DC

## 2024-03-05 NOTE — Patient Instructions (Signed)
 Keep a food diary and look for foods that increase gas like dairy.  Start probiotic daily  Restart wegovy .5mg  weekly

## 2024-03-06 ENCOUNTER — Other Ambulatory Visit (HOSPITAL_COMMUNITY): Payer: Self-pay

## 2024-03-06 ENCOUNTER — Encounter: Payer: Self-pay | Admitting: Physician Assistant

## 2024-03-06 NOTE — Progress Notes (Signed)
 Established Patient Office Visit  Subjective   Patient ID: Jodi Perry, female    DOB: 1961/10/05  Age: 63 y.o. MRN: [id number]  Chief Complaint  Patient presents with   Medical Management of Chronic Issues    Wgt management fup , no pap,pt will return at a later date, pt would like to titrate down on wegovy    HPI Pt is a 63 yo female who presents to the clinic for weight follow up.   She was on wegovy and doing very well but could not get her 2.4mg  dose refilled. She has been out of it since November and started to gain weight back. She would like to restart. She is walking and staying active. She has chronic low back pain that worsens with weight gain.   Pt has had gas really bad since her colitis in December. Wonders what she can do about it. No abdominal pain. No diarrhea or constipation. Denies any melena or hematochezia. Gas is very odorus.   .. 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   Allergic conjunctivitis of both eyes 12/02/2017   Seasonal allergies 08/29/2018   Elevated blood pressure reading 08/29/2018   Acute right hip pain 01/14/2020   Chronic bilateral low back pain without sciatica 01/14/2020   Elevated LDL cholesterol level 01/22/2020   Greater trochanteric bursitis of right hip 08/29/2020   Hot flashes, menopausal 09/03/2020   Lumbar degenerative disc disease 09/03/2020   Primary osteoarthritis of both hips 09/03/2020   Stress 03/09/2023   Post-menopausal 03/09/2023   Primary hypertension 06/10/2023   Overweight (BMI 25.0-29.9) 06/10/2023   History of obesity 06/10/2023   Drug-induced constipation  09/12/2023   Flatulence 03/05/2024   Weight gain 03/05/2024   Resolved Ambulatory Problems    Diagnosis Date Noted   Migraine headache 09/08/2010   Depression 10/29/2014   Abnormal weight gain 10/29/2014   Tetanus toxoid vaccination within the past 10 years 03/05/2015   BPPV (benign paroxysmal positional vertigo) 07/15/2015   Thumb pain 12/30/2015   No Additional Past Medical History     Review of Systems  All other systems reviewed and are negative.     Objective:     BP 133/77   Pulse 68   Ht 5\' 1"  (1.549 m)   Wt 147 lb (66.7 kg)   LMP 07/23/2017   SpO2 99%   BMI 27.78 kg/m  BP Readings from Last 3 Encounters:  03/05/24 133/77  11/14/23 122/70  09/12/23 122/78   Wt Readings from Last 3 Encounters:  03/05/24 147 lb (66.7 kg)  11/14/23 137 lb 3.2 oz (62.2 kg)  09/12/23 144 lb (65.3 kg)      Physical Exam Constitutional:      Appearance: Normal appearance.  HENT:     Head: Normocephalic.  Cardiovascular:     Rate and Rhythm: Normal rate and regular rhythm.  Pulmonary:     Effort: Pulmonary effort is normal.     Breath sounds: Normal breath sounds.  Musculoskeletal:     Right lower leg: No edema.     Left lower leg: No edema.  Neurological:     General: No focal deficit present.  Mental Status: She is alert and oriented to person, place, and time.  Psychiatric:        Mood and Affect: Mood normal.       The 10-year ASCVD risk score (Arnett DK, et al., 2019) is: 5.3%    Assessment & Plan:  Marland KitchenMarland KitchenAdasia was seen today for medical management of chronic issues.  Diagnoses and all orders for this visit:  History of obesity -     WEGOVY 0.25 MG/0.5ML SOAJ; Inject 0.25 mg into the skin once a week. Use this dose for 1 month (4 shots) and then increase to next higher dose. -     WEGOVY 0.5 MG/0.5ML SOAJ; Inject 0.5 mg into the skin once a week. Use this dose for 1 month (4 shots) and then increase to next higher dose. -     WEGOVY 1 MG/0.5ML SOAJ;  Inject 1 mg into the skin once a week. Use this dose for 1 month (4 shots) and then increase to next higher dose.  Flatulence  Overweight (BMI 25.0-29.9) -     WEGOVY 0.25 MG/0.5ML SOAJ; Inject 0.25 mg into the skin once a week. Use this dose for 1 month (4 shots) and then increase to next higher dose. -     WEGOVY 0.5 MG/0.5ML SOAJ; Inject 0.5 mg into the skin once a week. Use this dose for 1 month (4 shots) and then increase to next higher dose. -     WEGOVY 1 MG/0.5ML SOAJ; Inject 1 mg into the skin once a week. Use this dose for 1 month (4 shots) and then increase to next higher dose.  Weight gain -     WEGOVY 0.25 MG/0.5ML SOAJ; Inject 0.25 mg into the skin once a week. Use this dose for 1 month (4 shots) and then increase to next higher dose. -     WEGOVY 0.5 MG/0.5ML SOAJ; Inject 0.5 mg into the skin once a week. Use this dose for 1 month (4 shots) and then increase to next higher dose. -     WEGOVY 1 MG/0.5ML SOAJ; Inject 1 mg into the skin once a week. Use this dose for 1 month (4 shots) and then increase to next higher dose.  Chronic bilateral low back pain without sciatica -     WEGOVY 0.25 MG/0.5ML SOAJ; Inject 0.25 mg into the skin once a week. Use this dose for 1 month (4 shots) and then increase to next higher dose. -     WEGOVY 0.5 MG/0.5ML SOAJ; Inject 0.5 mg into the skin once a week. Use this dose for 1 month (4 shots) and then increase to next higher dose. -     WEGOVY 1 MG/0.5ML SOAJ; Inject 1 mg into the skin once a week. Use this dose for 1 month (4 shots) and then increase to next higher dose. -     gabapentin (NEURONTIN) 300 MG capsule; TAKE 1 CAPSULE BY MOUTH EVERYDAY AT BEDTIME   Restart wegovy with titration back up Follow up in 3 months Continue with healthy diet and exercise For gas start with probiotic daily and food diary most common foods are dairy to cause gas Follow up as needed    Return in about 3 months (around 06/05/2024) for Needs CPE in 3 months.     Tandy Gaw, PA-C

## 2024-03-07 ENCOUNTER — Telehealth: Payer: Self-pay

## 2024-03-07 ENCOUNTER — Other Ambulatory Visit (HOSPITAL_COMMUNITY): Payer: Self-pay

## 2024-03-07 ENCOUNTER — Telehealth: Payer: Self-pay | Admitting: Pharmacy Technician

## 2024-03-07 NOTE — Telephone Encounter (Signed)
 Pharmacy Patient Advocate Encounter  Received notification from [facility] that Prior Authorization for [facility] 0.25MG /0.5ML auto-injectors has been APPROVED from 03/07/24 to 10/07/24. Ran test claim, Copay is $24.99. This test claim was processed through [facility]- copay amounts may vary at other pharmacies due to pharmacy/plan contracts, or as the patient moves through the different stages of their insurance plan.   PA #/Case ID/Reference #: [id number]

## 2024-03-07 NOTE — Telephone Encounter (Signed)
 Pharmacy Patient Advocate Encounter   Received notification from [employer] that prior authorization for Wegovy 0.25MG /0.5ML auto-injectors is required/requested.   Insurance verification completed.   The patient is insured through [facility] .   Per test claim: PA required; PA submitted to above mentioned insurance via CoverMyMeds Key/confirmation #/EOC [id number] Status is pending

## 2024-03-12 ENCOUNTER — Ambulatory Visit: Payer: BC Managed Care – PPO | Admitting: Physician Assistant

## 2024-03-12 ENCOUNTER — Other Ambulatory Visit (HOSPITAL_COMMUNITY): Payer: Self-pay

## 2024-03-12 NOTE — Telephone Encounter (Signed)
 Pharmacy Patient Advocate Encounter  Received notification from [facility] that Prior Authorization for Wegovy 0.25 has been APPROVED from 03/07/24 to 10/07/24. Ran test claim, Copay is $24.99 for a 28 day supply. This test claim was processed through [facility]- copay amounts may vary at other pharmacies due to pharmacy/plan contracts, or as the patient moves through the different stages of their insurance plan.

## 2024-04-05 ENCOUNTER — Other Ambulatory Visit: Payer: Self-pay | Admitting: Physician Assistant

## 2024-04-05 DIAGNOSIS — G43009 Migraine without aura, not intractable, without status migrainosus: Secondary | ICD-10-CM

## 2024-04-12 ENCOUNTER — Other Ambulatory Visit: Payer: Self-pay | Admitting: Physician Assistant

## 2024-04-12 DIAGNOSIS — J302 Other seasonal allergic rhinitis: Secondary | ICD-10-CM

## 2024-04-12 DIAGNOSIS — M545 Low back pain, unspecified: Secondary | ICD-10-CM

## 2024-04-12 DIAGNOSIS — I10 Essential (primary) hypertension: Secondary | ICD-10-CM

## 2024-04-12 DIAGNOSIS — M7061 Trochanteric bursitis, right hip: Secondary | ICD-10-CM

## 2024-04-12 DIAGNOSIS — H1013 Acute atopic conjunctivitis, bilateral: Secondary | ICD-10-CM

## 2024-06-05 ENCOUNTER — Encounter: Admitting: Physician Assistant

## 2024-06-05 ENCOUNTER — Encounter: Payer: Self-pay | Admitting: Physician Assistant

## 2024-06-05 ENCOUNTER — Ambulatory Visit (INDEPENDENT_AMBULATORY_CARE_PROVIDER_SITE_OTHER): Admitting: Physician Assistant

## 2024-06-05 VITALS — BP 130/72 | HR 61 | Ht 61.5 in | Wt 149.5 lb

## 2024-06-05 DIAGNOSIS — Z780 Asymptomatic menopausal state: Secondary | ICD-10-CM

## 2024-06-05 DIAGNOSIS — Z1231 Encounter for screening mammogram for malignant neoplasm of breast: Secondary | ICD-10-CM | POA: Diagnosis not present

## 2024-06-05 DIAGNOSIS — Z0000 Encounter for general adult medical examination without abnormal findings: Secondary | ICD-10-CM | POA: Diagnosis not present

## 2024-06-05 DIAGNOSIS — Z13220 Encounter for screening for lipoid disorders: Secondary | ICD-10-CM | POA: Diagnosis not present

## 2024-06-05 DIAGNOSIS — M5136 Other intervertebral disc degeneration, lumbar region with discogenic back pain only: Secondary | ICD-10-CM

## 2024-06-05 DIAGNOSIS — E663 Overweight: Secondary | ICD-10-CM

## 2024-06-05 DIAGNOSIS — Z131 Encounter for screening for diabetes mellitus: Secondary | ICD-10-CM | POA: Diagnosis not present

## 2024-06-05 MED ORDER — WEGOVY 1.7 MG/0.75ML ~~LOC~~ SOAJ: 1.7000 mg | SUBCUTANEOUS | 0 refills | Status: DC

## 2024-06-05 MED ORDER — WEGOVY 2.4 MG/0.75ML ~~LOC~~ SOAJ: 2.4000 mg | SUBCUTANEOUS | 2 refills | Status: DC

## 2024-06-05 NOTE — Progress Notes (Unsigned)
 Complete physical exam  Patient: Jodi Perry   DOB: 09-05-1961   63 y.o. Female  MRN: [id number]  Subjective:    Chief Complaint  Patient presents with   Annual Exam    Patient here for annual physical without pap ( HM showing not due until 2026) patient is fasting today. - patient is due for Mammogram. Declines Covid vaccine today.     Jodi Perry is a 63 y.o. female who presents today for a complete physical exam. She reports consuming a {diet types:17450} diet. {types:19826} She generally feels {DESC; WELL/FAIRLY WELL/POORLY:18703}. She reports sleeping {DESC; WELL/FAIRLY WELL/POORLY:18703}. She {does/does not:200015} have additional problems to discuss today.    Most recent fall risk assessment:    06/05/2024    8:32 AM  Fall Risk   Falls in the past year? 0  Number falls in past yr: 0  Injury with Fall? 0  Risk for fall due to : No Fall Risks  Follow up Falls evaluation completed     Most recent depression screenings:    06/05/2024    8:36 AM 09/12/2023    8:43 AM  PHQ 2/9 Scores  PHQ - 2 Score 0 0  PHQ- 9 Score 2 0    {VISON DENTAL STD PSA (Optional):27386}  {History (Optional):23778}  Patient Care Team: Indyah Saulnier L, PA-C as PCP - General (Family Medicine)   Outpatient Medications Prior to Visit  Medication Sig   azelastine  (OPTIVAR ) 0.05 % ophthalmic solution PUT 1 DROP INTO BOTH EYES TWICE A DAY   fexofenadine  (ALLEGRA ) 180 MG tablet TAKE 1 TABLET BY MOUTH EVERY DAY   fluticasone  (FLONASE ) 50 MCG/ACT nasal spray SPRAY 2 SPRAYS INTO EACH NOSTRIL EVERY DAY   gabapentin  (NEURONTIN ) 300 MG capsule TAKE 1 CAPSULE BY MOUTH EVERYDAY AT BEDTIME   HAWTHORN PO Take by mouth.   hydrochlorothiazide  (MICROZIDE ) 12.5 MG capsule TAKE 1 CAPSULE BY MOUTH EVERY DAY   levocetirizine (XYZAL ) 5 MG tablet TAKE 1 TABLET BY MOUTH EVERY DAY IN THE EVENING   meloxicam  (MOBIC ) 15 MG tablet TAKE 1 TABLET (15 MG TOTAL) BY MOUTH DAILY.   methocarbamol  (ROBAXIN ) 500 MG  tablet TAKE 1 TABLET BY MOUTH THREE TIMES A DAY   SUMAtriptan  (IMITREX ) 100 MG tablet TAKE 1 TABLET FOR MIGRAINE MAY REPEAT IN 2 HRS IF HEADACHE PERSISTS   [DISCONTINUED] WEGOVY  1 MG/0.5ML SOAJ Inject 1 mg into the skin once a week. Use this dose for 1 month (4 shots) and then increase to next higher dose.   [DISCONTINUED] WEGOVY  0.25 MG/0.5ML SOAJ Inject 0.25 mg into the skin once a week. Use this dose for 1 month (4 shots) and then increase to next higher dose. (Patient not taking: Reported on 06/05/2024)   [DISCONTINUED] WEGOVY  0.5 MG/0.5ML SOAJ Inject 0.5 mg into the skin once a week. Use this dose for 1 month (4 shots) and then increase to next higher dose. (Patient not taking: Reported on 06/05/2024)   [DISCONTINUED] WEGOVY  2.4 MG/0.75ML SOAJ Inject 2.4 mg into the skin once a week. (Patient not taking: Reported on 06/05/2024)   No facility-administered medications prior to visit.    ROS        Objective:     BP (!) 142/76   Pulse 61   Ht 5' 1.5 (1.562 m)   Wt 149 lb 8 oz (67.8 kg)   LMP 07/23/2017   SpO2 98%   BMI 27.79 kg/m  {Vitals History (Optional):23777}  Physical Exam   No results found for any visits  on 06/05/24. {Show previous labs (optional):23779}    Assessment & Plan:    Routine Health Maintenance and Physical Exam  Immunization History  Administered Date(s) Administered   PFIZER Comirnaty(Gray Top)Covid-19 Tri-Sucrose Vaccine 12/31/2020   PFIZER(Purple Top)SARS-COV-2 Vaccination 03/06/2020, 03/31/2020   Tdap 03/03/2015    Health Maintenance  Topic Date Due   MAMMOGRAM  03/08/2024   Zoster Vaccines- Shingrix (1 of 2) 06/05/2024 (Originally 08/12/1981)   COVID-19 Vaccine (4 - 2024-25 season) 06/21/2024 (Originally 08/14/2023)   Cervical Cancer Screening (HPV/Pap Cotest)  03/05/2025 (Originally 08/12/1992)   HIV Screening  03/23/2027 (Originally 08/12/1977)   INFLUENZA VACCINE  07/13/2024   DTaP/Tdap/Td (2 - Td or Tdap) 03/02/2025   Fecal DNA (Cologuard)   12/17/2026   Hepatitis C Screening  Completed   Hepatitis B Vaccines  Aged Out   HPV VACCINES  Aged Out   Meningococcal B Vaccine  Aged Out    Discussed health benefits of physical activity, and encouraged her to engage in regular exercise appropriate for her age and condition.  Problem List Items Addressed This Visit       Unprioritized   Post-menopausal   Relevant Orders   VITAMIN D  25 Hydroxy (Vit-D Deficiency, Fractures)   Other Visit Diagnoses       Routine adult health maintenance    -  Primary   Relevant Orders   CBC with Differential/Platelet   CMP14+EGFR   Lipid panel   TSH   VITAMIN D  25 Hydroxy (Vit-D Deficiency, Fractures)   MM 3D SCREENING MAMMOGRAM BILATERAL BREAST     Visit for screening mammogram       Relevant Orders   MM 3D SCREENING MAMMOGRAM BILATERAL BREAST     Screening for lipid disorders       Relevant Orders   Lipid panel     Screening for diabetes mellitus       Relevant Orders   CMP14+EGFR      Return in about 3 months (around 09/05/2024).     Woodson Macha, PA-C

## 2024-06-05 NOTE — Patient Instructions (Addendum)
 Increase wegovy  to 1.7mg  then 2.4mg   Health Maintenance, Female Adopting a healthy lifestyle and getting preventive care are important in promoting health and wellness. Ask your health care provider about: The right schedule for you to have regular tests and exams. Things you can do on your own to prevent diseases and keep yourself healthy. What should I know about diet, weight, and exercise? Eat a healthy diet  Eat a diet that includes plenty of vegetables, fruits, low-fat dairy products, and lean protein. Do not eat a lot of foods that are high in solid fats, added sugars, or sodium. Maintain a healthy weight Body mass index (BMI) is used to identify weight problems. It estimates body fat based on height and weight. Your health care provider can help determine your BMI and help you achieve or maintain a healthy weight. Get regular exercise Get regular exercise. This is one of the most important things you can do for your health. Most adults should: Exercise for at least 150 minutes each week. The exercise should increase your heart rate and make you sweat (moderate-intensity exercise). Do strengthening exercises at least twice a week. This is in addition to the moderate-intensity exercise. Spend less time sitting. Even light physical activity can be beneficial. Watch cholesterol and blood lipids Have your blood tested for lipids and cholesterol at 62 years of age, then have this test every 5 years. Have your cholesterol levels checked more often if: Your lipid or cholesterol levels are high. You are older than 62 years of age. You are at high risk for heart disease. What should I know about cancer screening? Depending on your health history and family history, you may need to have cancer screening at various ages. This may include screening for: Breast cancer. Cervical cancer. Colorectal cancer. Skin cancer. Lung cancer. What should I know about heart disease, diabetes, and high  blood pressure? Blood pressure and heart disease High blood pressure causes heart disease and increases the risk of stroke. This is more likely to develop in people who have high blood pressure readings or are overweight. Have your blood pressure checked: Every 3-5 years if you are 28-25 years of age. Every year if you are 82 years old or older. Diabetes Have regular diabetes screenings. This checks your fasting blood sugar level. Have the screening done: Once every three years after age 47 if you are at a normal weight and have a low risk for diabetes. More often and at a younger age if you are overweight or have a high risk for diabetes. What should I know about preventing infection? Hepatitis B If you have a higher risk for hepatitis B, you should be screened for this virus. Talk with your health care provider to find out if you are at risk for hepatitis B infection. Hepatitis C Testing is recommended for: Everyone born from [birth year] through 1964. Anyone with known risk factors for hepatitis C. Sexually transmitted infections (STIs) Get screened for STIs, including gonorrhea and chlamydia, if: You are sexually active and are younger than 62 years of age. You are older than 62 years of age and your health care provider tells you that you are at risk for this type of infection. Your sexual activity has changed since you were last screened, and you are at increased risk for chlamydia or gonorrhea. Ask your health care provider if you are at risk. Ask your health care provider about whether you are at high risk for HIV. Your health care provider may recommend  a prescription medicine to help prevent HIV infection. If you choose to take medicine to prevent HIV, you should first get tested for HIV. You should then be tested every 3 months for as long as you are taking the medicine. Pregnancy If you are about to stop having your period (premenopausal) and you may become pregnant, seek counseling  before you get pregnant. Take 400 to 800 micrograms (mcg) of folic acid every day if you become pregnant. Ask for birth control (contraception) if you want to prevent pregnancy. Osteoporosis and menopause Osteoporosis is a disease in which the bones lose minerals and strength with aging. This can result in bone fractures. If you are 26 years old or older, or if you are at risk for osteoporosis and fractures, ask your health care provider if you should: Be screened for bone loss. Take a calcium or vitamin D  supplement to lower your risk of fractures. Be given hormone replacement therapy (HRT) to treat symptoms of menopause. Follow these instructions at home: Alcohol use Do not drink alcohol if: Your health care provider tells you not to drink. You are pregnant, may be pregnant, or are planning to become pregnant. If you drink alcohol: Limit how much you have to: 0-1 drink a day. Know how much alcohol is in your drink. In the [place] one drink equals one 12 oz bottle of beer (355 mL), one 5 oz glass of wine (148 mL), or one 1 oz glass of hard liquor (44 mL). Lifestyle Do not use any products that contain nicotine or tobacco. These products include cigarettes, chewing tobacco, and vaping devices, such as e-cigarettes. If you need help quitting, ask your health care provider. Do not use street drugs. Do not share needles. Ask your health care provider for help if you need support or information about quitting drugs. General instructions Schedule regular health, dental, and eye exams. Stay current with your vaccines. Tell your health care provider if: You often feel depressed. You have ever been abused or do not feel safe at home. Summary Adopting a healthy lifestyle and getting preventive care are important in promoting health and wellness. Follow your health care provider's instructions about healthy diet, exercising, and getting tested or screened for diseases. Follow your health care  provider's instructions on monitoring your cholesterol and blood pressure. This information is not intended to replace advice given to you by your health care provider. Make sure you discuss any questions you have with your health care provider. Document Revised: 04/20/2021 Document Reviewed: 04/20/2021 Elsevier Patient Education  2024 [employer].

## 2024-06-06 ENCOUNTER — Ambulatory Visit: Payer: Self-pay | Admitting: Physician Assistant

## 2024-06-06 LAB — CMP14+EGFR
ALT: 14 IU/L (ref 0–32)
AST: 22 IU/L (ref 0–40)
Albumin: 4.4 g/dL (ref 3.9–4.9)
Alkaline Phosphatase: 86 IU/L (ref 44–121)
BUN/Creatinine Ratio: 33 — ABNORMAL HIGH (ref 12–28)
BUN: 18 mg/dL (ref 8–27)
Bilirubin Total: 0.4 mg/dL (ref 0.0–1.2)
CO2: 21 mmol/L (ref 20–29)
Calcium: 9.4 mg/dL (ref 8.7–10.3)
Chloride: 101 mmol/L (ref 96–106)
Creatinine, Ser: 0.54 mg/dL — ABNORMAL LOW (ref 0.57–1.00)
Globulin, Total: 2.3 g/dL (ref 1.5–4.5)
Glucose: 78 mg/dL (ref 70–99)
Potassium: 4.2 mmol/L (ref 3.5–5.2)
Sodium: 139 mmol/L (ref 134–144)
Total Protein: 6.7 g/dL (ref 6.0–8.5)
eGFR: 105 mL/min/{1.73_m2} (ref >59)

## 2024-06-06 LAB — CBC WITH DIFFERENTIAL/PLATELET
Basophils Absolute: 0 10*3/uL (ref 0.0–0.2)
Basos: 1 %
EOS (ABSOLUTE): 0.2 10*3/uL (ref 0.0–0.4)
Eos: 3 %
Hematocrit: 40.2 % (ref 34.0–46.6)
Hemoglobin: 13.2 g/dL (ref 11.1–15.9)
Immature Grans (Abs): 0 10*3/uL (ref 0.0–0.1)
Immature Granulocytes: 0 %
Lymphocytes Absolute: 2.8 10*3/uL (ref 0.7–3.1)
Lymphs: 38 %
MCH: 29.5 pg (ref 26.6–33.0)
MCHC: 32.8 g/dL (ref 31.5–35.7)
MCV: 90 fL (ref 79–97)
Monocytes Absolute: 0.5 10*3/uL (ref 0.1–0.9)
Monocytes: 7 %
Neutrophils Absolute: 3.9 10*3/uL (ref 1.4–7.0)
Neutrophils: 51 %
Platelets: 246 10*3/uL (ref 150–450)
RBC: 4.47 x10E6/uL (ref 3.77–5.28)
RDW: 13.7 % (ref 11.7–15.4)
WBC: 7.5 10*3/uL (ref 3.4–10.8)

## 2024-06-06 LAB — VITAMIN D 25 HYDROXY (VIT D DEFICIENCY, FRACTURES): Vit D, 25-Hydroxy: 31.8 ng/mL (ref 30.0–100.0)

## 2024-06-06 LAB — LIPID PANEL
Chol/HDL Ratio: 3 ratio (ref 0.0–4.4)
Cholesterol, Total: 190 mg/dL (ref 100–199)
HDL: 63 mg/dL (ref >39)
LDL Chol Calc (NIH): 113 mg/dL — ABNORMAL HIGH (ref 0–99)
Triglycerides: 78 mg/dL (ref 0–149)
VLDL Cholesterol Cal: 14 mg/dL (ref 5–40)

## 2024-06-06 LAB — TSH: TSH: 18.3 u[IU]/mL — ABNORMAL HIGH (ref 0.450–4.500)

## 2024-06-06 MED ORDER — LEVOTHYROXINE SODIUM 50 MCG PO TABS: 50.0000 ug | ORAL_TABLET | Freq: Every day | ORAL | 1 refills | Status: DC

## 2024-06-06 NOTE — Progress Notes (Signed)
 Dorothe,   Kidney and liver look good.  Glucose fasting looks great.  Hemoglobin looks good.  HDL, good cholesterol, looks great.  LDL, bad cholesterol, not quite to goal but looks good.   10 year cardiovascular risk under 7.5 percent ok to continue with lifestyle changes and follow up in 1 year.   .The 10-year ASCVD risk score (Arnett DK, et al., 2019) is: 4.5%   Values used to calculate the score:     Age: 63 years     Clincally relevant sex: Female     Is Non-Hispanic African American: No     Diabetic: No     Tobacco smoker: No     Systolic Blood Pressure: 130 mmHg     Is BP treated: Yes     HDL Cholesterol: 63 mg/dL     Total Cholesterol: 190 mg/dL  You are very hypothyroid and this could be making you resistance to losing weight. We need to start thyroid medication in the morning on empty stomach 30 minutes before you eat anything. Are you ok with this? Recheck labs in 6-8 weeks .

## 2024-06-25 ENCOUNTER — Other Ambulatory Visit: Payer: Self-pay | Admitting: Physician Assistant

## 2024-06-25 DIAGNOSIS — G8929 Other chronic pain: Secondary | ICD-10-CM

## 2024-06-25 DIAGNOSIS — M7061 Trochanteric bursitis, right hip: Secondary | ICD-10-CM

## 2024-06-27 ENCOUNTER — Ambulatory Visit: Admitting: Physician Assistant

## 2024-06-27 ENCOUNTER — Encounter: Payer: Self-pay | Admitting: Physician Assistant

## 2024-06-27 VITALS — BP 137/87 | HR 87 | Temp 98.3°F | Ht 61.0 in | Wt 147.0 lb

## 2024-06-27 DIAGNOSIS — M545 Low back pain, unspecified: Secondary | ICD-10-CM | POA: Diagnosis not present

## 2024-06-27 DIAGNOSIS — M7061 Trochanteric bursitis, right hip: Secondary | ICD-10-CM | POA: Diagnosis not present

## 2024-06-27 DIAGNOSIS — G8929 Other chronic pain: Secondary | ICD-10-CM

## 2024-06-27 DIAGNOSIS — M542 Cervicalgia: Secondary | ICD-10-CM

## 2024-06-27 DIAGNOSIS — R202 Paresthesia of skin: Secondary | ICD-10-CM

## 2024-06-27 MED ORDER — OXYCODONE-ACETAMINOPHEN 7.5-325 MG PO TABS: 1.0000 | ORAL_TABLET | Freq: Four times a day (QID) | ORAL | 0 refills | Status: DC | PRN

## 2024-06-27 MED ORDER — PREDNISONE 50 MG PO TABS: ORAL_TABLET | ORAL | 0 refills | Status: DC

## 2024-06-27 MED ORDER — VALACYCLOVIR HCL 1 G PO TABS: 1000.0000 mg | ORAL_TABLET | Freq: Three times a day (TID) | ORAL | 0 refills | Status: AC

## 2024-06-27 MED ORDER — MELOXICAM 15 MG PO TABS: 15.0000 mg | ORAL_TABLET | Freq: Every day | ORAL | 1 refills | Status: DC

## 2024-06-27 NOTE — Progress Notes (Signed)
 Acute Office Visit  Subjective:     Patient ID: Jodi Perry, female    DOB: [birth year], 62 y.o.   MRN: [id number]  Chief Complaint  Patient presents with   Medical Management of Chronic Issues    Possible shingles infection   Patient is in today for what she believes is a possible shingles infection. Patient states that on July 1st she began to feel tenderness and burning behind her ear, the back of her neck by the base of her skull, above her collar bone, and slightly within the jaw; this is all specifically on the right side. This pain is worse when anything touches it. She does not have any skin changes, rash, or blisters present. She states the gabapentin  she is prescribed does not help her pain or help with sleep.  Patient also admits to experiencing pain in left hip, where bursitis is present so she is unable to sleep on her left side as well. She has been also experiencing a headache and fatigue (due to being unable to sleep). Patient initially thought this was due to starting a new thyroid medication, which she has since stopped. Patient has not had the shingles vaccine.   Review of Systems  Constitutional:  Negative for chills and fever.  Skin:  Negative for itching and rash.  Neurological:  Positive for headaches. Negative for tingling and focal weakness.      Objective:    BP 137/87   Pulse 87   Temp 98.3 F (36.8 C) (Oral)   Ht 5' 1 (1.549 m)   Wt 66.7 kg   LMP 07/23/2017   SpO2 99%   BMI 27.78 kg/m  {Vitals History (Optional):23777}  Physical Exam Constitutional:      Appearance: Normal appearance.  HENT:     Head: Normocephalic and atraumatic.      Comments: Areas of pain/ tenderness to palpation. No skin changes, rashes, lesions, erythema present. Follows the C2- C3 dermatome distribution patter. Specifically on the right side.  Cardiovascular:     Rate and Rhythm: Normal rate.  Pulmonary:     Effort: Pulmonary effort is normal.  Musculoskeletal:         General: Normal range of motion.     Cervical back: Normal range of motion.  Skin:    General: Skin is warm and dry.     Findings: No erythema, lesion or rash.     Comments: No rash or lesions present.   Neurological:     General: No focal deficit present.     Mental Status: She is alert and oriented to person, place, and time.     No results found for any visits on 06/27/24.     Assessment & Plan:   Problem List Items Addressed This Visit       Musculoskeletal and Integument   Greater trochanteric bursitis of right hip   Relevant Medications   meloxicam  (MOBIC ) 15 MG tablet     Other   Chronic bilateral low back pain without sciatica   Relevant Medications   predniSONE  (DELTASONE ) 50 MG tablet   meloxicam  (MOBIC ) 15 MG tablet   oxyCODONE -acetaminophen  (PERCOCET) 7.5-325 MG tablet    Meds ordered this encounter  Medications   valACYclovir  (VALTREX ) 1000 MG tablet    Sig: Take 1 tablet (1,000 mg total) by mouth 3 (three) times daily.    Dispense:  21 tablet    Refill:  0    Supervising Provider:   METHENEY, CATHERINE D [[id number]]  predniSONE  (DELTASONE ) 50 MG tablet    Sig: One tab PO daily for 5 days.    Dispense:  5 tablet    Refill:  0    Supervising Provider:   METHENEY, CATHERINE D [[id number]]   meloxicam  (MOBIC ) 15 MG tablet    Sig: Take 1 tablet (15 mg total) by mouth daily.    Dispense:  90 tablet    Refill:  1    Supervising Provider:   METHENEY, CATHERINE D [[id number]]   oxyCODONE -acetaminophen  (PERCOCET) 7.5-325 MG tablet    Sig: Take 1 tablet by mouth every 6 (six) hours as needed for severe pain (pain score 7-10).    Dispense:  15 tablet    Refill:  0    Supervising Provider:   METHENEY, CATHERINE D [[id number]]   Shingles infection  - Start Valtrex , sent into pharmacy  - Start Prednisone  taper, sent into pharmacy  - Continue Gabapentin , can take 2 tablets at night to help with sleep - Percocet sent into pharmacy for breakthrough pain - Meloxicam  refill sent  in, educated patient to take this after she is finished her prednisone  taper - Recommended trying cold compresses on the affected areas to help with pain relief and discomfort - If rash develops, contact office for potentially culturing blisters   No follow-ups on file.  Tinnie FORBES Patient, Student-PA

## 2024-06-27 NOTE — Patient Instructions (Addendum)
 Use cold compresses over neck and effected area Consider icy hot patches Increase gabapentin  to 600mg  at bedtime Start valtrex  and prednisone  Start mobic  after prednisone  finished Take oxycodone  as needed for breakthrough pain  Shingles  Shingles, or herpes zoster, is an infection. It gives you a skin rash and blisters. These infected areas may hurt a lot. Shingles only happens if: You've had chickenpox. You've been given a shot called a vaccine to protect you from getting chickenpox. Shingles is rare in this case. What are the causes? Shingles is caused by a germ called the varicella-zoster virus. This is the same germ that causes chickenpox. After you're exposed to the germ, it stays in your body but is dormant. This means it isn't active. Shingles happens if the germ becomes active again. This can happen years after you're first exposed to the germ. What increases the risk? You may be more likely to get shingles if: You're older than 62 years of age. You're under a lot of stress. You have a weak immune system. The immune system is your body's defense system. It may be weak if: You have human immunodeficiency virus (HIV). You have acquired immunodeficiency syndrome (AIDS). You have cancer. You take medicines that weaken your immune system. These include organ transplant medicines. What are the signs or symptoms? The first symptoms of shingles may be itching, tingling, or pain. Your skin may feel like it's burning. A few days or weeks later, you'll get a rash. Here's what you can expect: The rash is likely to be on one side of your body. The rash may be shaped like a belt or a band. Over time, it will turn into blisters filled with fluid. The blisters will break open and change into scabs. The scabs will dry up in about 2-3 weeks. You may also have: A fever. Chills. A headache. Nausea. How is this diagnosed? Shingles is diagnosed with a skin exam. A sample called a culture may  be taken from one of your blisters and sent to a lab. This will show if you have shingles. How is this treated? The rash may last for several weeks. There's no cure for shingles, but your health care provider may give you medicines. These medicines may: Help with pain. Help with itching. Help with irritation and swelling. Help you get better sooner. Help to prevent long-term problems. If the rash is on your face, you may need to see an eye doctor or an ear, nose, and throat (ENT) doctor. Follow these instructions at home: Medicines Take your medicines only as told by your provider. Put an anti-itch cream or numbing cream on the rash or blisters as told by your provider. Relieving itching and discomfort  To help with itching: Put cold, wet cloths called cold compresses on the rash or blisters. Take a cool bath. Try adding baking soda or dry oatmeal to the water. Do not bathe in hot water. Use calamine lotion on the rash or blisters. You can get this type of lotion at the store. Blister and rash care Keep your rash covered with a loose bandage. Wear loose clothes that don't rub on your rash. Take care of your rash as told by your provider. Make sure you: Wash your hands with soap and water for at least 20 seconds before and after you change your bandage. If you can't use soap and water, use hand sanitizer. Keep your rash and blisters clean by washing them with mild soap and cool water. Change your bandage.  Check your rash every day for signs of infection. Check for: More redness, swelling, or pain. Fluid or blood. Warmth. Pus or a bad smell. Do not scratch your rash. Do not pick at your blisters. To help you not scratch: Keep your fingernails clean and cut short. Try to wear gloves or mittens when you sleep. General instructions Rest. Wash your hands often with soap and water for at least 20 seconds. If you can't use soap and water, use hand sanitizer. Washing your hands lowers your  chance of getting a skin infection. Your infection can cause chickenpox in others. If you have blisters that aren't scabs yet, stay away from: Babies. Pregnant people. Children who have eczema. Older people who have organ transplants. People who have a long-term, or chronic, illness. Anyone who hasn't had chickenpox before. Anyone who hasn't gotten the chickenpox vaccine. How is this prevented? Vaccines are the best way to prevent you from getting chickenpox or shingles. Talk with your provider about getting these shots. Where to find more information Centers for Disease Control and Prevention (CDC): TonerProm[employer].no Contact a health care provider if: Your pain doesn't get better with medicine. Your pain doesn't get better after the rash heals. You have any signs of infection around the rash. Your rash or blisters get worse. You have a fever or chills. Get help right away if: The rash is on your face or nose. You have pain in your face or by your eye. You lose feeling on one side of your face. You have trouble seeing. You have ear pain or ringing in your ear. This information is not intended to replace advice given to you by your health care provider. Make sure you discuss any questions you have with your health care provider. Document Revised: 09/01/2023 Document Reviewed: 01/14/2023 Elsevier Patient Education  2024 [employer].

## 2024-06-28 ENCOUNTER — Other Ambulatory Visit: Payer: Self-pay | Admitting: Physician Assistant

## 2024-06-29 ENCOUNTER — Encounter: Payer: Self-pay | Admitting: Physician Assistant

## 2024-06-29 DIAGNOSIS — M542 Cervicalgia: Secondary | ICD-10-CM | POA: Insufficient documentation

## 2024-06-29 DIAGNOSIS — R202 Paresthesia of skin: Secondary | ICD-10-CM | POA: Insufficient documentation

## 2024-07-04 ENCOUNTER — Ambulatory Visit (INDEPENDENT_AMBULATORY_CARE_PROVIDER_SITE_OTHER)

## 2024-07-04 DIAGNOSIS — Z1231 Encounter for screening mammogram for malignant neoplasm of breast: Secondary | ICD-10-CM | POA: Diagnosis not present

## 2024-07-06 ENCOUNTER — Other Ambulatory Visit: Payer: Self-pay

## 2024-07-06 DIAGNOSIS — Z1329 Encounter for screening for other suspected endocrine disorder: Secondary | ICD-10-CM

## 2024-07-06 NOTE — Telephone Encounter (Signed)
 Please call patient I see where the pharmacy had requested a 90-day supply.  But we really have to recheck her TSH in about 2 weeks so I would rather her just get the 30-day refill on the original prescription that was sent in June because we may have to change or adjust her dose.  Chances are we will likely will we rarely get it right on the very first dosing trial.  So again if she can just pick up the refill and then come in in about 2 weeks to have her TSH redrawn that would be fantastic.  If we can make sure that the TSH is also ready and pending

## 2024-07-11 NOTE — Progress Notes (Signed)
 Please call patient. Normal mammogram.  Repeat in 1 year.

## 2024-07-23 ENCOUNTER — Encounter: Payer: Self-pay | Admitting: Physician Assistant

## 2024-07-23 DIAGNOSIS — M5136 Other intervertebral disc degeneration, lumbar region with discogenic back pain only: Secondary | ICD-10-CM

## 2024-07-23 NOTE — Telephone Encounter (Signed)
 2.4 mg pescription already at pharmacy.  The pharmacy is questioning whehter to fill two more months of 1.7mg  or increase the strength now to 2.4mg 

## 2024-07-24 MED ORDER — WEGOVY 2.4 MG/0.75ML ~~LOC~~ SOAJ: 2.4000 mg | SUBCUTANEOUS | 2 refills | Status: DC

## 2024-07-24 NOTE — Telephone Encounter (Signed)
 Pended rx for refill

## 2024-07-27 DIAGNOSIS — Z1329 Encounter for screening for other suspected endocrine disorder: Secondary | ICD-10-CM | POA: Diagnosis not present

## 2024-07-28 LAB — TSH: TSH: 4.23 u[IU]/mL (ref 0.450–4.500)

## 2024-07-29 ENCOUNTER — Other Ambulatory Visit: Payer: Self-pay | Admitting: Physician Assistant

## 2024-07-30 ENCOUNTER — Ambulatory Visit: Payer: Self-pay | Admitting: Physician Assistant

## 2024-07-30 MED ORDER — LEVOTHYROXINE SODIUM 50 MCG PO TABS: 50.0000 ug | ORAL_TABLET | Freq: Every day | ORAL | 1 refills | Status: DC

## 2024-07-30 NOTE — Progress Notes (Signed)
 Thyroid looks MUCH better. Just to confirm you are taking 50mcg daily?

## 2024-08-04 ENCOUNTER — Other Ambulatory Visit: Payer: Self-pay | Admitting: Physician Assistant

## 2024-08-04 DIAGNOSIS — G8929 Other chronic pain: Secondary | ICD-10-CM

## 2024-09-05 ENCOUNTER — Ambulatory Visit: Admitting: Physician Assistant

## 2024-10-09 ENCOUNTER — Other Ambulatory Visit (HOSPITAL_COMMUNITY): Payer: Self-pay

## 2024-10-11 ENCOUNTER — Telehealth: Payer: Self-pay | Admitting: Pharmacy Technician

## 2024-10-11 ENCOUNTER — Other Ambulatory Visit (HOSPITAL_COMMUNITY): Payer: Self-pay

## 2024-10-11 DIAGNOSIS — M5136 Other intervertebral disc degeneration, lumbar region with discogenic back pain only: Secondary | ICD-10-CM

## 2024-10-11 NOTE — Telephone Encounter (Signed)
 Pharmacy Patient Advocate Encounter  Received notification from [facility] that Prior Authorization for Wegovy  2.4mg /0.44ml has been APPROVED from 10/11/24 to 10/11/25   PA #/Case ID/Reference #: [id number]  Approval letter indexed to media tab

## 2024-10-11 NOTE — Telephone Encounter (Signed)
 Pharmacy Patient Advocate Encounter   Received notification from [employer] that prior authorization for Wegovy  2.4MG /0.75ML auto-injectors is due for renewal.   Insurance verification completed.   The patient is insured through [facility].  Action: PA required; PA submitted to above mentioned insurance via Latent Key/confirmation #/EOC [id number] Status is pending

## 2024-10-12 MED ORDER — WEGOVY 2.4 MG/0.75ML ~~LOC~~ SOAJ: 2.4000 mg | SUBCUTANEOUS | 2 refills | Status: DC

## 2024-10-12 NOTE — Addendum Note (Signed)
 Addended by: Geno Sydnor P on: 10/12/2024 09:15 AM   Modules accepted: Orders

## 2024-10-12 NOTE — Telephone Encounter (Signed)
 Requesting rx rf of  Wegovy  2.4mg   Last written 07/24/2024 Last OV 06/27/2024 Upcoming appt = none  Patient also wanting to know if it is safe taking this for a year?

## 2024-10-12 NOTE — Addendum Note (Signed)
 Addended by: ANTONIETTE VERMELL CROME on: 10/12/2024 12:13 PM   Modules accepted: Orders

## 2024-10-18 NOTE — Telephone Encounter (Signed)
 Patient informed and will call back to schedule the 3 month follow up to discuss triturating medication down

## 2024-11-21 ENCOUNTER — Other Ambulatory Visit: Payer: Self-pay | Admitting: Physician Assistant

## 2024-11-21 DIAGNOSIS — H1013 Acute atopic conjunctivitis, bilateral: Secondary | ICD-10-CM

## 2024-11-21 DIAGNOSIS — J302 Other seasonal allergic rhinitis: Secondary | ICD-10-CM

## 2024-11-21 DIAGNOSIS — I10 Essential (primary) hypertension: Secondary | ICD-10-CM

## 2024-12-17 ENCOUNTER — Ambulatory Visit: Admitting: Physician Assistant

## 2024-12-17 VITALS — BP 136/82 | HR 71 | Ht 61.0 in | Wt 143.0 lb

## 2024-12-17 DIAGNOSIS — E663 Overweight: Secondary | ICD-10-CM

## 2024-12-17 DIAGNOSIS — I10 Essential (primary) hypertension: Secondary | ICD-10-CM | POA: Diagnosis not present

## 2024-12-17 DIAGNOSIS — M545 Low back pain, unspecified: Secondary | ICD-10-CM

## 2024-12-17 DIAGNOSIS — G8929 Other chronic pain: Secondary | ICD-10-CM | POA: Diagnosis not present

## 2024-12-17 DIAGNOSIS — Z8639 Personal history of other endocrine, nutritional and metabolic disease: Secondary | ICD-10-CM | POA: Diagnosis not present

## 2024-12-17 DIAGNOSIS — E039 Hypothyroidism, unspecified: Secondary | ICD-10-CM

## 2024-12-17 DIAGNOSIS — R058 Other specified cough: Secondary | ICD-10-CM | POA: Diagnosis not present

## 2024-12-17 DIAGNOSIS — M7061 Trochanteric bursitis, right hip: Secondary | ICD-10-CM | POA: Diagnosis not present

## 2024-12-17 DIAGNOSIS — M5136 Other intervertebral disc degeneration, lumbar region with discogenic back pain only: Secondary | ICD-10-CM | POA: Diagnosis not present

## 2024-12-17 MED ORDER — BENZONATATE 200 MG PO CAPS: 200.0000 mg | ORAL_CAPSULE | Freq: Three times a day (TID) | ORAL | 0 refills | Status: AC | PRN

## 2024-12-17 MED ORDER — WEGOVY 2.4 MG/0.75ML ~~LOC~~ SOAJ: 2.4000 mg | SUBCUTANEOUS | 5 refills | Status: DC

## 2024-12-17 MED ORDER — MELOXICAM 15 MG PO TABS: 15.0000 mg | ORAL_TABLET | Freq: Every day | ORAL | 1 refills | Status: AC

## 2024-12-17 MED ORDER — GABAPENTIN 300 MG PO CAPS: ORAL_CAPSULE | ORAL | 1 refills | Status: AC

## 2024-12-17 MED ORDER — PROMETHAZINE-DM 6.25-15 MG/5ML PO SYRP: 5.0000 mL | ORAL_SOLUTION | Freq: Four times a day (QID) | ORAL | 0 refills | Status: AC | PRN

## 2024-12-17 NOTE — Progress Notes (Signed)
 "  Established Patient Office Visit  Subjective   Patient ID: Merryl Buckels, female    DOB: [birth year]  Age: 64 y.o. MRN: [id number]  Chief Complaint  Patient presents with   Medical Management of Chronic Issues    HPI Discussed the use of AI scribe software for clinical note transcription with the patient, who gave verbal consent to proceed.  History of Present Illness Najiyah Paris is a 64 year old female with primary hypertension and obesity who presents for medication refills.  Hypertension - Blood pressure at intake 159/97 mmHg, attributed to rushing to the appointment - Historical blood pressure typically in the 130s mmHg - Currently taking antihypertensive medication with careful timing around thyroid  medication  Obesity and weight management - BMI currently 27 - Weight 143 pounds - Weight loss of 4 pounds since July 2025 - Recent slight weight gain over the holidays and while on steroids due to increased appetite - Currently using Wegovy  for weight management - Denies any significant side effects to Wegovy   Recent respiratory illness - Recent diagnosis of bronchitis and sinus infection - Completed courses of azithromycin and steroid as of yesterday - Persistent cough, especially nocturnal - Used Mucinex for phlegm management - Glycine in Mucinex can trigger migraines - Practicing breathing exercises to aid recovery - No current shortness of breath - Oxygen saturation 98%  Thyroid  disorder - Currently taking thyroid  medication at 1 AM to optimize absorption - Avoids other medications four hours before and after thyroid  medication - Thyroid  levels normalized four months ago  Musculoskeletal pain - Occasional back pain flare-ups - Manages symptoms with core strengthening exercises and proper lifting techniques at work - Currently taking gabapentin  and meloxicam .   Immunization status - No flu, pneumonia, COVID, or shingles vaccines received  today    ROS See HPI.    Objective:     BP 136/82   Pulse 71   Ht 5' 1 (1.549 m)   Wt 143 lb (64.9 kg)   LMP 07/23/2017   SpO2 98%   BMI 27.02 kg/m  BP Readings from Last 3 Encounters:  12/17/24 136/82  06/27/24 137/87  06/05/24 130/72   Wt Readings from Last 3 Encounters:  12/17/24 143 lb (64.9 kg)  06/27/24 147 lb (66.7 kg)  06/05/24 149 lb 8 oz (67.8 kg)      Physical Exam Constitutional:      Appearance: Normal appearance.  HENT:     Head: Normocephalic.  Cardiovascular:     Rate and Rhythm: Normal rate and regular rhythm.  Pulmonary:     Effort: Pulmonary effort is normal.     Breath sounds: Normal breath sounds.  Musculoskeletal:     Right lower leg: No edema.     Left lower leg: No edema.  Neurological:     General: No focal deficit present.     Mental Status: She is alert and oriented to person, place, and time.  Psychiatric:        Mood and Affect: Mood normal.      The 10-year ASCVD risk score (Arnett DK, et al., 2019) is: 5.5%    Assessment & Plan:  SABRASABRAZollie was seen today for medical management of chronic issues.  Diagnoses and all orders for this visit:  Overweight (BMI 25.0-29.9) -     CMP14+EGFR -     WEGOVY  2.4 MG/0.75ML SOAJ SQ injection; Inject 2.4 mg into the skin once a week.  Primary hypertension -     CMP14+EGFR -  WEGOVY  2.4 MG/0.75ML SOAJ SQ injection; Inject 2.4 mg into the skin once a week.  Post-viral cough syndrome -     benzonatate  (TESSALON ) 200 MG capsule; Take 1 capsule (200 mg total) by mouth 3 (three) times daily as needed. -     promethazine -dextromethorphan (PROMETHAZINE -DM) 6.25-15 MG/5ML syrup; Take 5 mLs by mouth 4 (four) times daily as needed.  Degeneration of intervertebral disc of lumbar region with discogenic back pain -     WEGOVY  2.4 MG/0.75ML SOAJ SQ injection; Inject 2.4 mg into the skin once a week. -     meloxicam  (MOBIC ) 15 MG tablet; Take 1 tablet (15 mg total) by mouth daily.  Acquired  hypothyroidism -     TSH + free T4  Chronic bilateral low back pain without sciatica -     WEGOVY  2.4 MG/0.75ML SOAJ SQ injection; Inject 2.4 mg into the skin once a week. -     gabapentin  (NEURONTIN ) 300 MG capsule; TAKE 1 CAPSULE BY MOUTH EVERYDAY AT BEDTIME -     meloxicam  (MOBIC ) 15 MG tablet; Take 1 tablet (15 mg total) by mouth daily.  Greater trochanteric bursitis of right hip -     meloxicam  (MOBIC ) 15 MG tablet; Take 1 tablet (15 mg total) by mouth daily.  History of obesity -     WEGOVY  2.4 MG/0.75ML SOAJ SQ injection; Inject 2.4 mg into the skin once a week.   Assessment & Plan Primary hypertension Blood pressure elevated initially, likely due to rushing and recent illness. Improved upon recheck. - Continue hydrochlorothiazide  12.5mg  daily.   Overweight/HTN/Chronic pain due to DDD BMI reduced to 27 with weight loss. Currently on Wegovy  2.4 mg. - Continue Wegovy  2.4 mg. - Encouraged 60g of protein a day - Encouraged regular exercise with 150 minutes of walking weekly  Post-viral cough syndrome Persistent cough post-bronchitis and sinus infection. Likely post-viral inflammation. - Prescribed cough syrup for nighttime use. - Prescribed Tessalon  Perles for non-sedating cough relief.  Degeneration of lumbar intervertebral disc with discogenic back pain Intermittent back pain with flare-ups. Engaging in core exercises and proper lifting. - Continue core strengthening exercises and proper lifting techniques. - Continue gabapentin  and meloxicam .  Acquired hypothyroidism Thyroid  function normalized. Adheres to medication regimen. - Ordered thyroid  function tests. - Continue current thyroid  medication regimen.      Sheryle Vice, PA-C  "

## 2024-12-18 ENCOUNTER — Ambulatory Visit: Payer: Self-pay | Admitting: Physician Assistant

## 2024-12-18 ENCOUNTER — Encounter: Payer: Self-pay | Admitting: Physician Assistant

## 2024-12-18 LAB — SPECIMEN STATUS

## 2024-12-18 LAB — CMP14+EGFR
ALT: 13 IU/L (ref 0–32)
AST: 19 IU/L (ref 0–40)
Albumin: 4.3 g/dL (ref 3.9–4.9)
Alkaline Phosphatase: 109 IU/L (ref 49–135)
BUN/Creatinine Ratio: 23 (ref 12–28)
BUN: 13 mg/dL (ref 8–27)
Bilirubin Total: 0.3 mg/dL (ref 0.0–1.2)
CO2: 26 mmol/L (ref 20–29)
Calcium: 9.7 mg/dL (ref 8.7–10.3)
Chloride: 101 mmol/L (ref 96–106)
Creatinine, Ser: 0.56 mg/dL — ABNORMAL LOW (ref 0.57–1.00)
Globulin, Total: 2.4 g/dL (ref 1.5–4.5)
Glucose: 83 mg/dL (ref 70–99)
Potassium: 4.2 mmol/L (ref 3.5–5.2)
Sodium: 138 mmol/L (ref 134–144)
Total Protein: 6.7 g/dL (ref 6.0–8.5)
eGFR: 103 mL/min/1.73

## 2024-12-18 LAB — TSH+FREE T4
Free T4: 1.14 ng/dL (ref 0.82–1.77)
TSH: 6.84 u[IU]/mL — ABNORMAL HIGH (ref 0.450–4.500)

## 2024-12-18 LAB — SPECIMEN STATUS REPORT

## 2024-12-18 MED ORDER — LEVOTHYROXINE SODIUM 75 MCG PO TABS: 75.0000 ug | ORAL_TABLET | Freq: Every day | ORAL | 0 refills | Status: AC

## 2024-12-18 NOTE — Progress Notes (Signed)
 Dorothe,   TSH trending up. Increased levothyroxine  dose to 75mcg daily. Recheck labs in 3 months.   Kidney and liver look good.

## 2024-12-18 NOTE — Progress Notes (Signed)
 Patient informed of results   Did you want to have all labs rechecked in 3 months or just the TSH and I will place the order?

## 2025-01-06 ENCOUNTER — Other Ambulatory Visit: Payer: Self-pay | Admitting: Physician Assistant

## 2025-01-06 DIAGNOSIS — E663 Overweight: Secondary | ICD-10-CM

## 2025-01-06 DIAGNOSIS — M5136 Other intervertebral disc degeneration, lumbar region with discogenic back pain only: Secondary | ICD-10-CM

## 2025-01-06 DIAGNOSIS — I10 Essential (primary) hypertension: Secondary | ICD-10-CM

## 2025-01-06 DIAGNOSIS — M545 Low back pain, unspecified: Secondary | ICD-10-CM

## 2025-01-06 DIAGNOSIS — Z8639 Personal history of other endocrine, nutritional and metabolic disease: Secondary | ICD-10-CM

## 2025-06-17 ENCOUNTER — Ambulatory Visit: Admitting: Physician Assistant
